# Patient Record
Sex: Female | Born: 1960 | Race: White | Hispanic: No | Marital: Single | Smoking: Current every day smoker
Health system: Southern US, Community
[De-identification: ages and names within clinical notes are randomized; demographics above are authoritative.]

## PROBLEM LIST (undated history)

## (undated) DIAGNOSIS — M1990 Unspecified osteoarthritis, unspecified site: Secondary | ICD-10-CM

## (undated) DIAGNOSIS — F1010 Alcohol abuse, uncomplicated: Secondary | ICD-10-CM

---

## 2015-05-04 ENCOUNTER — Encounter (HOSPITAL_COMMUNITY): Payer: Self-pay | Admitting: *Deleted

## 2015-05-04 ENCOUNTER — Emergency Department (HOSPITAL_COMMUNITY)
Admission: EM | Admit: 2015-05-04 | Discharge: 2015-05-04 | Disposition: A | Discharge status: Home / Self Care | Payer: Self-pay | Attending: Emergency Medicine | Admitting: Emergency Medicine

## 2015-05-04 DIAGNOSIS — Z5900 Homelessness unspecified: Secondary | ICD-10-CM

## 2015-05-04 DIAGNOSIS — E860 Dehydration: Secondary | ICD-10-CM | POA: Insufficient documentation

## 2015-05-04 DIAGNOSIS — Z79899 Other long term (current) drug therapy: Secondary | ICD-10-CM | POA: Insufficient documentation

## 2015-05-04 DIAGNOSIS — R531 Weakness: Secondary | ICD-10-CM | POA: Insufficient documentation

## 2015-05-04 DIAGNOSIS — Z7982 Long term (current) use of aspirin: Secondary | ICD-10-CM | POA: Insufficient documentation

## 2015-05-04 DIAGNOSIS — Z720 Tobacco use: Secondary | ICD-10-CM | POA: Insufficient documentation

## 2015-05-04 DIAGNOSIS — R42 Dizziness and giddiness: Secondary | ICD-10-CM | POA: Insufficient documentation

## 2015-05-04 LAB — CBC WITH DIFFERENTIAL/PLATELET
BASOS ABS: 0.1 10*3/uL (ref 0.0–0.1)
BASOS PCT: 0 % (ref 0–1)
Eosinophils Absolute: 0.1 10*3/uL (ref 0.0–0.7)
Eosinophils Relative: 1 % (ref 0–5)
HCT: 42.1 % (ref 36.0–46.0)
HEMOGLOBIN: 14.3 g/dL (ref 12.0–15.0)
Lymphocytes Relative: 17 % (ref 12–46)
Lymphs Abs: 2.1 10*3/uL (ref 0.7–4.0)
MCH: 32.4 pg (ref 26.0–34.0)
MCHC: 34 g/dL (ref 30.0–36.0)
MCV: 95.5 fL (ref 78.0–100.0)
MONO ABS: 1.9 10*3/uL — AB (ref 0.1–1.0)
Monocytes Relative: 16 % — ABNORMAL HIGH (ref 3–12)
Neutro Abs: 8.1 10*3/uL — ABNORMAL HIGH (ref 1.7–7.7)
Neutrophils Relative %: 66 % (ref 43–77)
Platelets: 207 10*3/uL (ref 150–400)
RBC: 4.41 MIL/uL (ref 3.87–5.11)
RDW: 13.7 % (ref 11.5–15.5)
WBC: 12.2 10*3/uL — AB (ref 4.0–10.5)

## 2015-05-04 LAB — BASIC METABOLIC PANEL
Anion gap: 19 — ABNORMAL HIGH (ref 5–15)
BUN: 10 mg/dL (ref 6–20)
CALCIUM: 10 mg/dL (ref 8.9–10.3)
CO2: 25 mmol/L (ref 22–32)
Chloride: 92 mmol/L — ABNORMAL LOW (ref 101–111)
Creatinine, Ser: 1.05 mg/dL — ABNORMAL HIGH (ref 0.44–1.00)
GFR calc Af Amer: >60 mL/min (ref >60)
GFR, EST NON AFRICAN AMERICAN: 59 mL/min — AB (ref >60)
Glucose, Bld: 80 mg/dL (ref 65–99)
Potassium: 3.4 mmol/L — ABNORMAL LOW (ref 3.5–5.1)
SODIUM: 136 mmol/L (ref 135–145)

## 2015-05-04 MED ORDER — SODIUM CHLORIDE 0.9 % IV BOLUS (SEPSIS): 1000.0000 mL | Freq: Once | INTRAVENOUS | Status: DC
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
## 2015-05-04 NOTE — ED Provider Notes (Signed)
CSN: [id number]     Arrival date & time 05/04/15  1356 History   First MD Initiated Contact with Patient 05/04/15 1516     Chief Complaint  Patient presents with  . Dehydration     (Consider location/radiation/quality/duration/timing/severity/associated sxs/prior Treatment) HPI Comments: Patient is a 54 year old female with history of osteoarthritis of the hip, but no other significant medical history. She presents for complaints of dizziness and possible dehydration. She states that she relocated here approximately 2 months ago from [place] to be with family who is since moved. 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 The elderly female's grandson apparently removed her from the resident's after the passing of the grandmother. Since that time, the patient has had nowhere to stay and has no family locally. She states she is just been walking around town. While she was walking today she became dizzy and felt as though she was going to pass out. She reports having little to drink and nothing to eat in the past 2 days. She denies fevers or chills. She denies chest pain or shortness of breath.  The history is provided by the patient.    History reviewed. No pertinent past medical history. Past Surgical History  Procedure Laterality Date  . Cesarean section     No family history on file. History  Substance Use Topics  . Smoking status: Current Every Day Smoker -- 0.50 packs/day    Types: Cigarettes  . Smokeless tobacco: Not on file  . Alcohol Use: Yes     Comment: occasionally   OB History    No data available     Review of Systems  All other systems reviewed and are negative.     Allergies  Review of patient's allergies indicates no known allergies.  Home Medications   Prior to Admission medications   Medication Sig Start Date End Date Taking? Authorizing Provider  aspirin-acetaminophen-caffeine (EXCEDRIN MIGRAINE) [id number] MG per tablet  Take 1 tablet by mouth every 6 (six) hours as needed for headache.   Yes Historical Provider, MD  doxylamine, Sleep, (UNISOM) 25 MG tablet Take 25 mg by mouth at bedtime as needed for sleep.   Yes Historical Provider, MD  hydroxypropyl methylcellulose / hypromellose (ISOPTO TEARS / GONIOVISC) 2.5 % ophthalmic solution Place 1 drop into both eyes 3 (three) times daily.   Yes Historical Provider, MD   BP 98/58 mmHg  Pulse 119  Temp(Src) 98.6 F (37 C) (Oral)  Resp 18  SpO2 99% Physical Exam  Constitutional: She is oriented to person, place, and time. She appears well-developed and well-nourished. No distress.  Patient appears somewhat anxious.  HENT:  Head: Normocephalic and atraumatic.  Neck: Normal range of motion. Neck supple.  Cardiovascular: Exam reveals no gallop and no friction rub.   No murmur heard. Heart is tachycardic but regular.  Pulmonary/Chest: Effort normal and breath sounds normal. No respiratory distress. She has no wheezes.  Abdominal: Soft. Bowel sounds are normal. She exhibits no distension. There is no tenderness.  Musculoskeletal: Normal range of motion.  Neurological: She is alert and oriented to person, place, and time.  Skin: Skin is warm and dry. She is not diaphoretic.  Psychiatric: Her behavior is normal. Judgment and thought content normal. Her mood appears anxious. Her speech is rapid and/or pressured. Cognition and memory are normal.  Nursing note and vitals reviewed.   ED Course  Procedures (including critical care time) Labs Review Labs Reviewed  BASIC METABOLIC PANEL  CBC WITH DIFFERENTIAL/PLATELET    Imaging Review No results found.   EKG Interpretation None      MDM   Final diagnoses:  None    Patient presents here with complaints of weakness and dizziness and not having had much to eat or drink over the past 2 days for reasons listed in the history of present illness. She appears hemodynamically stable and neurologically intact.  She was given normal saline and laboratory studies are unremarkable. I see nothing medical keep her in the hospital for an believe she is appropriate for discharge.  She is currently homeless and has nowhere to go. She has no immediate family or friends in the area. She will be given the information for some of the homeless shelters in the area and will be discharged to home.    Geoffery Lyonsouglas Jazzma Neidhardt, MD 05/04/15 [phone]

## 2015-05-04 NOTE — ED Notes (Signed)
Pt. Remains in no distress; and I have given pt. Information I obtained earlier today from our social worker about the availability of local shelters.  Our S.W. Specifically [place].  I contacted them and they stated that they do have available beds.  The pt. Subsequently called them also; and they requested her to call them back at 1900 hours. Also I have obtained a [facility] voucher from [facility] To get her to the shelter.  Pt. Is tearfully grateful for the assistance.  I explain that we are only too happy to assist however we can.

## 2015-05-04 NOTE — Discharge Instructions (Signed)
Dehydration, Adult Dehydration is when you lose more fluids from the body than you take in. Vital organs like the kidneys, brain, and heart cannot function without a proper amount of fluids and salt. Any loss of fluids from the body can cause dehydration.  CAUSES   Vomiting.  Diarrhea.  Excessive sweating.  Excessive urine output.  Fever. SYMPTOMS  Mild dehydration  Thirst.  Dry lips.  Slightly dry mouth. Moderate dehydration  Very dry mouth.  Sunken eyes.  Skin does not bounce back quickly when lightly pinched and released.  Dark urine and decreased urine production.  Decreased tear production.  Headache. Severe dehydration  Very dry mouth.  Extreme thirst.  Rapid, weak pulse (more than 100 beats per minute at rest).  Cold hands and feet.  Not able to sweat in spite of heat and temperature.  Rapid breathing.  Blue lips.  Confusion and lethargy.  Difficulty being awakened.  Minimal urine production.  No tears. DIAGNOSIS  Your caregiver will diagnose dehydration based on your symptoms and your exam. Blood and urine tests will help confirm the diagnosis. The diagnostic evaluation should also identify the cause of dehydration. TREATMENT  Treatment of mild or moderate dehydration can often be done at home by increasing the amount of fluids that you drink. It is best to drink small amounts of fluid more often. Drinking too much at one time can make vomiting worse. Refer to the home care instructions below. Severe dehydration needs to be treated at the hospital where you will probably be given intravenous (IV) fluids that contain water and electrolytes. HOME CARE INSTRUCTIONS   Ask your caregiver about specific rehydration instructions.  Drink enough fluids to keep your urine clear or pale yellow.  Drink small amounts frequently if you have nausea and vomiting.  Eat as you normally do.  Avoid:  Foods or drinks high in sugar.  Carbonated  drinks.  Juice.  Extremely hot or cold fluids.  Drinks with caffeine.  Fatty, greasy foods.  Alcohol.  Tobacco.  Overeating.  Gelatin desserts.  Wash your hands well to avoid spreading bacteria and viruses.  Only take over-the-counter or prescription medicines for pain, discomfort, or fever as directed by your caregiver.  Ask your caregiver if you should continue all prescribed and over-the-counter medicines.  Keep all follow-up appointments with your caregiver. SEEK MEDICAL CARE IF:  You have abdominal pain and it increases or stays in one area (localizes).  You have a rash, stiff neck, or severe headache.  You are irritable, sleepy, or difficult to awaken.  You are weak, dizzy, or extremely thirsty. SEEK IMMEDIATE MEDICAL CARE IF:   You are unable to keep fluids down or you get worse despite treatment.  You have frequent episodes of vomiting or diarrhea.  You have blood or green matter (bile) in your vomit.  You have blood in your stool or your stool looks black and tarry.  You have not urinated in 6 to 8 hours, or you have only urinated a small amount of very dark urine.  You have a fever.  You faint. MAKE SURE YOU:   Understand these instructions.  Will watch your condition.  Will get help right away if you are not doing well or get worse. Document Released: 11/15/2005 Document Revised: 02/07/2012 Document Reviewed: 07/05/2011 Exit[employer] Patient Information 2015 [employer]. This information is not intended to replace advice given to you by your health care provider. Make sure you discuss any questions you have with your health care   provider.  

## 2015-10-25 ENCOUNTER — Emergency Department
Admission: EM | Admit: 2015-10-25 | Discharge: 2015-10-25 | Discharge status: Against Medical Advice | Payer: Self-pay | Attending: Emergency Medicine | Admitting: Emergency Medicine

## 2015-10-25 ENCOUNTER — Encounter: Payer: Self-pay | Admitting: Emergency Medicine

## 2015-10-25 DIAGNOSIS — K0889 Other specified disorders of teeth and supporting structures: Secondary | ICD-10-CM | POA: Insufficient documentation

## 2015-10-25 DIAGNOSIS — F17210 Nicotine dependence, cigarettes, uncomplicated: Secondary | ICD-10-CM | POA: Insufficient documentation

## 2015-10-25 NOTE — ED Notes (Signed)
States toothache x 3 months, worse 4 days

## 2015-10-25 NOTE — ED Notes (Addendum)
Pt left without being seen from the d pod waiting room.  Patient was under the impression that she could have her tooth pulled.  Patient left.

## 2015-10-27 ENCOUNTER — Encounter (HOSPITAL_COMMUNITY): Payer: Self-pay | Admitting: Emergency Medicine

## 2015-10-27 ENCOUNTER — Emergency Department (HOSPITAL_COMMUNITY)
Admission: EM | Admit: 2015-10-27 | Discharge: 2015-10-29 | Disposition: A | Discharge status: Other Acute Inpatient Hospital | Payer: Federal, State, Local not specified - Other | Attending: Emergency Medicine | Admitting: Emergency Medicine

## 2015-10-27 DIAGNOSIS — F17210 Nicotine dependence, cigarettes, uncomplicated: Secondary | ICD-10-CM | POA: Insufficient documentation

## 2015-10-27 DIAGNOSIS — Z792 Long term (current) use of antibiotics: Secondary | ICD-10-CM | POA: Insufficient documentation

## 2015-10-27 DIAGNOSIS — F4323 Adjustment disorder with mixed anxiety and depressed mood: Secondary | ICD-10-CM | POA: Diagnosis present

## 2015-10-27 DIAGNOSIS — F32A Depression, unspecified: Secondary | ICD-10-CM

## 2015-10-27 DIAGNOSIS — Z5900 Homelessness unspecified: Secondary | ICD-10-CM

## 2015-10-27 DIAGNOSIS — R062 Wheezing: Secondary | ICD-10-CM | POA: Insufficient documentation

## 2015-10-27 DIAGNOSIS — K029 Dental caries, unspecified: Secondary | ICD-10-CM | POA: Insufficient documentation

## 2015-10-27 DIAGNOSIS — R45851 Suicidal ideations: Secondary | ICD-10-CM

## 2015-10-27 DIAGNOSIS — F329 Major depressive disorder, single episode, unspecified: Secondary | ICD-10-CM | POA: Insufficient documentation

## 2015-10-27 DIAGNOSIS — F1020 Alcohol dependence, uncomplicated: Secondary | ICD-10-CM | POA: Insufficient documentation

## 2015-10-27 DIAGNOSIS — Z3202 Encounter for pregnancy test, result negative: Secondary | ICD-10-CM | POA: Insufficient documentation

## 2015-10-27 LAB — CBC WITH DIFFERENTIAL/PLATELET
Basophils Absolute: 0.1 10*3/uL (ref 0.0–0.1)
Basophils Relative: 1 %
EOS ABS: 0.1 10*3/uL (ref 0.0–0.7)
EOS PCT: 2 %
HCT: 43.1 % (ref 36.0–46.0)
Hemoglobin: 14.4 g/dL (ref 12.0–15.0)
LYMPHS ABS: 3.1 10*3/uL (ref 0.7–4.0)
LYMPHS PCT: 49 %
MCH: 31 pg (ref 26.0–34.0)
MCHC: 33.4 g/dL (ref 30.0–36.0)
MCV: 92.7 fL (ref 78.0–100.0)
MONO ABS: 0.5 10*3/uL (ref 0.1–1.0)
MONOS PCT: 8 %
Neutro Abs: 2.6 10*3/uL (ref 1.7–7.7)
Neutrophils Relative %: 40 %
PLATELETS: 263 10*3/uL (ref 150–400)
RBC: 4.65 MIL/uL (ref 3.87–5.11)
RDW: 17.7 % — ABNORMAL HIGH (ref 11.5–15.5)
WBC: 6.4 10*3/uL (ref 4.0–10.5)

## 2015-10-27 LAB — SALICYLATE LEVEL: Salicylate Lvl: <4 mg/dL (ref 2.8–30.0)

## 2015-10-27 LAB — COMPREHENSIVE METABOLIC PANEL
ALT: 22 U/L (ref 14–54)
AST: 23 U/L (ref 15–41)
Albumin: 4.1 g/dL (ref 3.5–5.0)
Alkaline Phosphatase: 124 U/L (ref 38–126)
Anion gap: 14 (ref 5–15)
BUN: 6 mg/dL (ref 6–20)
CALCIUM: 8.8 mg/dL — AB (ref 8.9–10.3)
CO2: 26 mmol/L (ref 22–32)
CREATININE: 0.46 mg/dL (ref 0.44–1.00)
Chloride: 105 mmol/L (ref 101–111)
GFR calc Af Amer: >60 mL/min (ref >60)
GFR calc non Af Amer: >60 mL/min (ref >60)
GLUCOSE: 97 mg/dL (ref 65–99)
Potassium: 3.6 mmol/L (ref 3.5–5.1)
Sodium: 145 mmol/L (ref 135–145)
TOTAL PROTEIN: 7.3 g/dL (ref 6.5–8.1)
Total Bilirubin: 0.5 mg/dL (ref 0.3–1.2)

## 2015-10-27 LAB — ETHANOL: Alcohol, Ethyl (B): 326 mg/dL (ref <5)

## 2015-10-27 LAB — PROTIME-INR
INR: 0.96 (ref 0.00–1.49)
PROTHROMBIN TIME: 13 s (ref 11.6–15.2)

## 2015-10-27 MED ORDER — LORAZEPAM 2 MG/ML IJ SOLN
1.0000 mg | Freq: Once | INTRAMUSCULAR | Status: AC
  Administered 2015-10-27: 1 mg via INTRAVENOUS
  Filled 2015-10-27: qty 1

## 2015-10-27 MED ORDER — IBUPROFEN 200 MG PO TABS
600.0000 mg | ORAL_TABLET | Freq: Three times a day (TID) | ORAL | Status: DC | PRN
  Administered 2015-10-27 – 2015-10-28 (×3): 600 mg via ORAL
  Filled 2015-10-27 (×3): qty 3

## 2015-10-27 MED ORDER — NICOTINE 21 MG/24HR TD PT24
21.0000 mg | MEDICATED_PATCH | Freq: Every day | TRANSDERMAL | Status: DC
  Administered 2015-10-27 – 2015-10-28 (×2): 21 mg via TRANSDERMAL
  Filled 2015-10-27 (×2): qty 1

## 2015-10-27 MED ORDER — ONDANSETRON HCL 4 MG PO TABS: 4.0000 mg | ORAL_TABLET | Freq: Three times a day (TID) | ORAL | Status: DC | PRN

## 2015-10-27 MED ORDER — M.V.I. ADULT IV INJ
INJECTION | Freq: Once | INTRAVENOUS | Status: AC
  Administered 2015-10-27: 22:00:00 via INTRAVENOUS
  Filled 2015-10-27: qty 1000

## 2015-10-27 NOTE — ED Notes (Addendum)
PT BIB EMS; Per EMS pt c/o suicidal ideation and extreme loneliness; EMS states that pt reported drinking alcohol to alleviate her dental pain that was not resolved after going to the dentist; EMS states that pt reports having several shots today; when this nurse asked pt about suicidal ideation, pt appeared surprised and said "God, No"; pt states she called EMS because her tooth was hurting and she couldn't take the pain; pt observed making phone call and saying "I'm in the hospital because I thought I was having a heart attack".

## 2015-10-27 NOTE — ED Notes (Addendum)
Pt complaint of upper right teeth pain for a week self treating with ETOH (half a fifth yesterday). When questioned about ideation of SI pt states "I was just joking." Pt tearful on assessment. With completion of assessment pt informed that we take SI threats seriously and must follow protocol; changing into scrubs for pt/staff safety. At first pt agrees but then refuses stating "I am not putting on those things. Look at them." Pfeiffer made aware and reports will evaluate pt.
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
## 2015-10-27 NOTE — B[facility] Counselor (Signed)
Disposition: Per Donell SievertSpencer Simon, PA, Pt to be re-evaluated by psychiatry in the morning once BAL has lowered.   Cyndie Mull- Machell Wirthlin, [facility]   Therapeutic Triage

## 2015-10-27 NOTE — ED Notes (Signed)
Robert at bedside attempting blood draw.

## 2015-10-27 NOTE — ED Provider Notes (Addendum)
CSN: [id number]     Arrival date & time 10/27/15  1724 History   First MD Initiated Contact with Patient 10/27/15 1748     Chief Complaint  Patient presents with  . Suicidal     (Consider location/radiation/quality/duration/timing/severity/associated sxs/prior Treatment) HPI Patient called 911 with chief complaint of suicidal ideation. The patient reports she had been drinking yesterday, but not today. She states she is drinking to alleviate dental pain. She reports she does not have any pain at this time and now wants to leave the hospital. Once here, the patient emphatically denied suicidal ideation to me. Then very shortly she was extremely tearful and admitted that she was suffering from depression which she reports she has never really had before. The EMS the patient had admitted extreme loneliness. The patient states she is currently living in a shelter [facility]. She does not have any friends or family in the area. She reports she has a [place] who is in [place]. History reviewed. No pertinent past medical history. Past Surgical History  Procedure Laterality Date  . Cesarean section     History reviewed. No pertinent family history. Social History  Substance Use Topics  . Smoking status: Current Every Day Smoker -- 0.50 packs/day    Types: Cigarettes  . Smokeless tobacco: None  . Alcohol Use: Yes     Comment: occasionally   OB History    No data available     Review of Systems 10 Systems reviewed and are negative for acute change except as noted in the HPI.    Allergies  Review of patient's allergies indicates no known allergies.  Home Medications   Prior to Admission medications   Medication Sig Start Date End Date Taking? Authorizing Provider  amoxicillin (AMOXIL) 500 MG capsule Take 500 mg by mouth 3 (three) times daily.   Yes Historical Provider, MD  diphenhydramine-acetaminophen (TYLENOL PM) 25-500 MG TABS tablet Take 1 tablet by mouth at bedtime  as needed (sleep).   Yes Historical Provider, MD  hydrOXYzine (VISTARIL) 25 MG capsule Take 25 mg by mouth every 4 (four) hours as needed for anxiety.  08/02/15  Yes Historical Provider, MD   There were no vitals taken for this visit. Physical Exam  Constitutional: She appears well-developed and well-nourished.  The patient smells of alcohol. She is up and ambulating in the emergency department. She has no respiratory distress. Her color is good.  HENT:  Head: Normocephalic and atraumatic.  Right Ear: External ear normal.  Left Ear: External ear normal.  Nose: Nose normal.  Patient has extremely poor dentition with several teeth decayed to the root level. There is however no drainage or discharge around the teeth. The tooth that her most recently been bothering her is the inferior posterior right molar. Is chronically very decayed. Posterior oropharynx is widely patent. There is no trismus normal range of motion of the jaw. No associated facial swelling.  Eyes: EOM are normal. Pupils are equal, round, and reactive to light.  Neck: Neck supple.  Cardiovascular: Normal rate, regular rhythm, normal heart sounds and intact distal pulses.   Pulmonary/Chest: Effort normal. No respiratory distress. She has wheezes.  Patient has bilateral respiratory wheezes.  Abdominal: Soft. Bowel sounds are normal. She exhibits no distension. There is no tenderness.  Musculoskeletal: Normal range of motion. She exhibits no edema.  Neurological: She is alert. She has normal strength. Coordination normal. GCS eye subscore is 4. GCS verbal subscore is 5. GCS motor subscore is 6.  Patient is alert. She was able to name the month but off on the date by 2 days. She is aware of the year. Patient was unsure of the city we were in. She reported having called 911 and therefore not sure. The patient's movements or cornea purposeful she has been ambulating and sitting and standing without gait instability.  Skin: Skin is warm,  dry and intact.  Psychiatric:  The patient's mood is extremely labile. She goes from trying to smile and tell me that she is not suicidal and then will become profusely tearful. The patient does smell of alcohol currently. At this time she is intoxicated but alert and aware to her surroundings.    ED Course  Procedures (including critical care time) Labs Review Labs Reviewed  COMPREHENSIVE METABOLIC PANEL  ETHANOL  SALICYLATE LEVEL  CBC WITH DIFFERENTIAL/PLATELET  PROTIME-INR  PREGNANCY, URINE  URINALYSIS, ROUTINE W REFLEX MICROSCOPIC (NOT AT [facility])  URINE RAPID DRUG SCREEN, HOSP PERFORMED    Imaging Review No results found. I have personally reviewed and evaluated these images and lab results as part of my medical decision-making.   EKG Interpretation None      MDM   Final diagnoses:  Depression  Suicidal ideation  Alcoholism (HCC)  Homelessness   Patient initiated the encounter by calling 911 with reported suicidal ideation and expressing extreme loneliness DMS. The patient does appear to be a chronic alcoholic. She is intoxicated at this time but alert and conversant. She appears at risk for severe depression and isolation. Although patient retracted her suicidal statements she appears to be very labile and mood and I feel that she is at risk of impulsive behavior and possible suicide. At this time the patient will be placed in IVC status pending psychiatric evaluation.   Behavioral health consultation advises for the patient to be reassessed later in the morning when alcohol level has decreased. Arby Barrette, MD 10/27/15 1901  Arby Barrette, MD 10/28/15 [phone]

## 2015-10-27 NOTE — ED Notes (Signed)
Pt has one pair of paints brown in color and one purple t shirt white tennis shoes  a cell phone black in color and a black pocket book all place in personal belonging bag and placed at nurse station at room 25.

## 2015-10-27 NOTE — ED Notes (Signed)
Critical ETOH 326. Nurse made aware of same.

## 2015-10-28 ENCOUNTER — Inpatient Hospital Stay (HOSPITAL_COMMUNITY): Admit: 2015-10-28 | Payer: Self-pay

## 2015-10-28 DIAGNOSIS — F1020 Alcohol dependence, uncomplicated: Secondary | ICD-10-CM | POA: Diagnosis not present

## 2015-10-28 DIAGNOSIS — F4323 Adjustment disorder with mixed anxiety and depressed mood: Secondary | ICD-10-CM | POA: Diagnosis not present

## 2015-10-28 LAB — URINALYSIS, ROUTINE W REFLEX MICROSCOPIC
Bilirubin Urine: NEGATIVE
Glucose, UA: NEGATIVE mg/dL
Ketones, ur: NEGATIVE mg/dL
Leukocytes, UA: NEGATIVE
NITRITE: NEGATIVE
Protein, ur: 100 mg/dL — AB
SPECIFIC GRAVITY, URINE: 1.019 (ref 1.005–1.030)
pH: 5.5 (ref 5.0–8.0)

## 2015-10-28 LAB — RAPID URINE DRUG SCREEN, HOSP PERFORMED
Amphetamines: NOT DETECTED
Barbiturates: NOT DETECTED
Benzodiazepines: NOT DETECTED
COCAINE: NOT DETECTED
OPIATES: NOT DETECTED
TETRAHYDROCANNABINOL: NOT DETECTED

## 2015-10-28 LAB — ETHANOL: ALCOHOL ETHYL (B): 136 mg/dL — AB (ref <5)

## 2015-10-28 LAB — URINE MICROSCOPIC-ADD ON

## 2015-10-28 LAB — PREGNANCY, URINE: Preg Test, Ur: NEGATIVE

## 2015-10-28 MED ORDER — LORAZEPAM 2 MG/ML IJ SOLN: 0.0000 mg | Freq: Four times a day (QID) | INTRAMUSCULAR | Status: DC

## 2015-10-28 MED ORDER — THIAMINE HCL 100 MG/ML IJ SOLN: 100.0000 mg | Freq: Every day | INTRAMUSCULAR | Status: DC

## 2015-10-28 MED ORDER — LORAZEPAM 1 MG PO TABS
0.0000 mg | ORAL_TABLET | Freq: Two times a day (BID) | ORAL | Status: DC
Start: 2015-10-28 — End: 2015-10-29

## 2015-10-28 MED ORDER — LORAZEPAM 1 MG PO TABS
0.0000 mg | ORAL_TABLET | Freq: Four times a day (QID) | ORAL | Status: DC
Start: 2015-10-28 — End: 2015-10-29
  Administered 2015-10-28: 1 mg via ORAL
  Filled 2015-10-28: qty 1

## 2015-10-28 MED ORDER — LORAZEPAM 1 MG PO TABS
2.0000 mg | ORAL_TABLET | Freq: Once | ORAL | Status: AC
  Administered 2015-10-28: 2 mg via ORAL
  Filled 2015-10-28: qty 2

## 2015-10-28 MED ORDER — LORAZEPAM 2 MG/ML IJ SOLN: 0.0000 mg | Freq: Two times a day (BID) | INTRAMUSCULAR | Status: DC

## 2015-10-28 MED ORDER — LORAZEPAM 1 MG PO TABS: 1.0000 mg | ORAL_TABLET | Freq: Once | ORAL | Status: DC

## 2015-10-28 MED ORDER — THIAMINE HCL 100 MG/ML IJ SOLN
100.0000 mg | Freq: Every day | INTRAMUSCULAR | Status: DC
Start: 2015-10-28 — End: 2015-10-28

## 2015-10-28 MED ORDER — TRAZODONE HCL 100 MG PO TABS
100.0000 mg | ORAL_TABLET | Freq: Every day | ORAL | Status: DC
  Administered 2015-10-28: 100 mg via ORAL
  Filled 2015-10-28: qty 1

## 2015-10-28 MED ORDER — SODIUM CHLORIDE 0.9 % IV SOLN
INTRAVENOUS | Status: DC
  Administered 2015-10-28: 02:00:00 via INTRAVENOUS

## 2015-10-28 MED ORDER — VITAMIN B-1 100 MG PO TABS: 100.0000 mg | ORAL_TABLET | Freq: Every day | ORAL | Status: DC

## 2015-10-28 MED ORDER — VITAMIN B-1 100 MG PO TABS
100.0000 mg | ORAL_TABLET | Freq: Every day | ORAL | Status: DC
  Administered 2015-10-28: 100 mg via ORAL
  Filled 2015-10-28: qty 1

## 2015-10-28 MED ORDER — PENICILLIN V POTASSIUM 500 MG PO TABS
500.0000 mg | ORAL_TABLET | Freq: Four times a day (QID) | ORAL | Status: DC
  Administered 2015-10-28 – 2015-10-29 (×3): 500 mg via ORAL
  Filled 2015-10-28 (×3): qty 1

## 2015-10-28 NOTE — ED Notes (Signed)
Per Madilyn Hookees, MD, normal saline discontinued

## 2015-10-28 NOTE — Progress Notes (Signed)
CM spoke with pt who confirms uninsured [place] resident with [place] pcp.  CM discussed and provided written information for uninsured accepting pcps, discussed the importance of pcp vs EDP services for f/u care, www.needymeds.org, www.goodrx.com, discounted pharmacies and other [place] resources such as [facility] , [facility], affordable care act, financial assistance, uninsured dental services, [place] med assist, DSS and  health department  Reviewed resources for [place] uninsured accepting pcps like [facility], family medicine at [address], [facility] [place], [facility], local urgent care centers, [facility], [facility] family practice, general medical clinics, family services of the [place], [facility] urgent care plus others, medication resources, [facility] out patient pharmacies and housing Pt voiced understanding and appreciation of resources provided   Provided [facility] contact information Pt seen by Kennyth ArnoldStacy from [facility]  [place] Pt has an active orange card expiring in march 2017 and allowed [facility] [place] Pt informed Stacy her pcp gave her an antibiotic to take prior to having dental procedure Pt given [facility] clinic information for homeless resource Pt does not have to have an orange card to go to [facility] ([facility] resource [facility]) to get services like showers, laundry, computer access, job counseling, resume help, referrals for food/clothing, Mental Health assistance & housing counseling Pt made aware  Pt will also have access to [facility] CM services and possible dental services prn Pt aware

## 2015-10-28 NOTE — Consult Note (Signed)
[facility] Psychiat[facility] Consult   Reason for Consult:  Alcohol   Intoxication, Suicidal ideation. Referring Physician:  EDP Patient Identification: Amanda Huff MRN:  [id number] Principal Diagnosis: Alcohol use disorder, moderate, dependence ([place]) Diagnosis:   Patient Active Problem List   Diagnosis Date Noted  . Alcohol use disorder, moderate, dependence ([place]) [F10.20] 10/28/2015    Priority: High  . Adjustment disorder with mixed anxiety and depressed mood [F43.23] 10/28/2015    Total Time spent with patient: 45 minutes  Subjective:   Amanda Huff is a 54 y.o. female patient admitted with Alcohol/intoxication, . Suicidal ideation  HPI:  Caucasian female, 54 years old was evaluated for Alcohol intoxication and use.  Patient was brought in by EMS when she reported feeling suicidal and lonely  She also was drinking and stated that she was drinking Alcohol to treat tooth pain. Patient admitted that she has issues with Alcohol but stated that she does not drink daily.  She moved down to [facility] to stay with her daughter and son in-law.  They have moved back to [place] leaving her here alone.  Patient states she has no body here and that she feels lonely.  She reports previous diagnosis and treatment for Depression long time ago in 1989.   She vehemently denies Suicidal ideation and stated that she was intoxicated when she stated she was going to kill herself.  She has been accepted for admission and we will be seeking placement at any facility with available bed.  Past Psychiatric History:  Depression long time ago  Risk to Self: Suicidal Ideation: No-Not Currently/Within Last 6 Months Suicidal Intent: No Is patient at risk for suicide?: No Suicidal Plan?: No Access to Means: No What has been your use of drugs/alcohol within the last 12 months?: Regular use of etoh approximately 1x per week How many times?: 0 Other Self Harm Risks: Possible etoh abuse Triggers for Past Attempts:   (n/a) Intentional Self Injurious Behavior: None Risk to Others: Homicidal Ideation: No Thoughts of Harm to Others: No Current Homicidal Intent: No Current Homicidal Plan: No Access to Homicidal Means: No Identified Victim: n/a History of harm to others?: No Assessment of Violence: None Noted Violent Behavior Description: No known hx of violence Does patient have access to weapons?: No Criminal Charges Pending?: No Does patient have a court date: No Prior Inpatient Therapy: Prior Inpatient Therapy: Yes Prior Therapy Dates: "Many years ago", 1990's Prior Therapy Facilty/Provider(s): Facilities in [place] Reason for Treatment: Depression Prior Outpatient Therapy: Prior Outpatient Therapy: Yes Prior Therapy Dates: "Many years ago" Prior Therapy Facilty/Provider(s): in [place] Reason for Treatment: Depression Does patient have an ACCT team?: No Does patient have Intensive In-House Services?  : No Does patient have [facility] services? : No Does patient have [facility] services?: No  Past Medical History: History reviewed. No pertinent past medical history.  Past Surgical History  Procedure Laterality Date  . Cesarean section     Family History: History reviewed. No pertinent family history.   Family Psychiatric  History: DENIES Social History:  History  Alcohol Use  . Yes    Comment: occasionally     History  Drug Use No    Social History   Social History  . Marital Status: Single    Spouse Name: N/A  . Number of Children: N/A  . Years of Education: N/A   Social History Main Topics  . Smoking status: Current Every Day Smoker -- 0.50 packs/day    Types: Cigarettes  . Smokeless tobacco:  None  . Alcohol Use: Yes     Comment: occasionally  . Drug Use: No  . Sexual Activity: Not Asked   Other Topics Concern  . None   Social History Narrative   Additional Social History:    Pain Medications: See PTA med list Prescriptions: See PTA med list Over the Counter: See PTA med  list History of alcohol / drug use?: Yes Longest period of sobriety (when/how long): Unknown Withdrawal Symptoms:  (Pt denies) Name of Substance 1: Etoh 1 - Age of First Use: teens 1 - Amount (size/oz): 3 vodka tonics 1 - Frequency: 1x weekly 1 - Duration: years 1 - Last Use / Amount: tonight, 10/27/15                   Allergies:  No Known Allergies  Labs:  Results for orders placed or performed during the hospital encounter of 10/27/15 (from the past 48 hour(s))  Comprehensive metabolic panel     Status: Abnormal   Collection Time: 10/27/15  7:50 PM  Result Value Ref Range   Sodium 145 135 - 145 mmol/L   Potassium 3.6 3.5 - 5.1 mmol/L   Chloride 105 101 - 111 mmol/L   CO2 26 22 - 32 mmol/L   Glucose, Bld 97 65 - 99 mg/dL   BUN 6 6 - 20 mg/dL   Creatinine, Ser 0.46 0.44 - 1.00 mg/dL   Calcium 8.8 (L) 8.9 - 10.3 mg/dL   Total Protein 7.3 6.5 - 8.1 g/dL   Albumin 4.1 3.5 - 5.0 g/dL   AST 23 15 - 41 U/L   ALT 22 14 - 54 U/L   Alkaline Phosphatase 124 38 - 126 U/L   Total Bilirubin 0.5 0.3 - 1.2 mg/dL   GFR calc non Af Amer >60 >60 mL/min   GFR calc Af Amer >60 >60 mL/min    Comment: (NOTE) The eGFR has been calculated using the CKD EPI equation. This calculation has not been validated in all clinical situations. eGFR's persistently <60 mL/min signify possible Chronic Kidney Disease.    Anion gap 14 5 - 15  Ethanol     Status: Abnormal   Collection Time: 10/27/15  7:50 PM  Result Value Ref Range   Alcohol, Ethyl (B) 326 (HH) <5 mg/dL    Comment:        LOWEST DETECTABLE LIMIT FOR SERUM ALCOHOL IS 5 mg/dL FOR MEDICAL PURPOSES ONLY CRITICAL RESULT CALLED TO, READ BACK BY AND VERIFIED WITH: DREWERY,K/ED @2040  ON 10/27/15 BY KARCZEWSKI,S.   Salicylate level     Status: None   Collection Time: 10/27/15  7:50 PM  Result Value Ref Range   Salicylate Lvl <7.2 2.8 - 30.0 mg/dL  CBC with Differential     Status: Abnormal   Collection Time: 10/27/15  7:50 PM   Result Value Ref Range   WBC 6.4 4.0 - 10.5 K/uL   RBC 4.65 3.87 - 5.11 MIL/uL   Hemoglobin 14.4 12.0 - 15.0 g/dL   HCT 43.1 36.0 - 46.0 %   MCV 92.7 78.0 - 100.0 fL   MCH 31.0 26.0 - 34.0 pg   MCHC 33.4 30.0 - 36.0 g/dL   RDW 17.7 (H) 11.5 - 15.5 %   Platelets 263 150 - 400 K/uL   Neutrophils Relative % 40 %   Neutro Abs 2.6 1.7 - 7.7 K/uL   Lymphocytes Relative 49 %   Lymphs Abs 3.1 0.7 - 4.0 K/uL   Monocytes Relative 8 %  Monocytes Absolute 0.5 0.1 - 1.0 K/uL   Eosinophils Relative 2 %   Eosinophils Absolute 0.1 0.0 - 0.7 K/uL   Basophils Relative 1 %   Basophils Absolute 0.1 0.0 - 0.1 K/uL  Protime-INR     Status: None   Collection Time: 10/27/15  7:50 PM  Result Value Ref Range   Prothrombin Time 13.0 11.6 - 15.2 seconds   INR 0.96 0.00 - 1.49  Pregnancy, urine     Status: None   Collection Time: 10/28/15  1:05 AM  Result Value Ref Range   Preg Test, Ur NEGATIVE NEGATIVE    Comment:        THE SENSITIVITY OF THIS METHODOLOGY IS >20 mIU/mL.   Urinalysis, Routine w reflex microscopic     Status: Abnormal   Collection Time: 10/28/15  1:05 AM  Result Value Ref Range   Color, Urine YELLOW YELLOW   APPearance CLOUDY (A) CLEAR   Specific Gravity, Urine 1.019 1.005 - 1.030   pH 5.5 5.0 - 8.0   Glucose, UA NEGATIVE NEGATIVE mg/dL   Hgb urine dipstick TRACE (A) NEGATIVE   Bilirubin Urine NEGATIVE NEGATIVE   Ketones, ur NEGATIVE NEGATIVE mg/dL   Protein, ur 100 (A) NEGATIVE mg/dL   Nitrite NEGATIVE NEGATIVE   Leukocytes, UA NEGATIVE NEGATIVE  Urine rapid drug screen (hosp performed)     Status: None   Collection Time: 10/28/15  1:05 AM  Result Value Ref Range   Opiates NONE DETECTED NONE DETECTED   Cocaine NONE DETECTED NONE DETECTED   Benzodiazepines NONE DETECTED NONE DETECTED   Amphetamines NONE DETECTED NONE DETECTED   Tetrahydrocannabinol NONE DETECTED NONE DETECTED   Barbiturates NONE DETECTED NONE DETECTED    Comment:        DRUG SCREEN FOR MEDICAL  PURPOSES ONLY.  IF CONFIRMATION IS NEEDED FOR ANY PURPOSE, NOTIFY LAB WITHIN 5 DAYS.        LOWEST DETECTABLE LIMITS FOR URINE DRUG SCREEN Drug Class       Cutoff (ng/mL) Amphetamine      1000 Barbiturate      200 Benzodiazepine   397 Tricyclics       673 Opiates          300 Cocaine          300 THC              50   Urine microscopic-add on     Status: Abnormal   Collection Time: 10/28/15  1:05 AM  Result Value Ref Range   Squamous Epithelial / LPF 0-5 (A) NONE SEEN    Comment: Please note change in reference range.   WBC, UA 0-5 0 - 5 WBC/hpf    Comment: Please note change in reference range.   RBC / HPF 0-5 0 - 5 RBC/hpf    Comment: Please note change in reference range.   Bacteria, UA RARE (A) NONE SEEN    Comment: Please note change in reference range.   Casts HYALINE CASTS (A) NEGATIVE  Ethanol     Status: Abnormal   Collection Time: 10/28/15  1:07 AM  Result Value Ref Range   Alcohol, Ethyl (B) 136 (H) <5 mg/dL    Comment:        LOWEST DETECTABLE LIMIT FOR SERUM ALCOHOL IS 5 mg/dL FOR MEDICAL PURPOSES ONLY     Current Facility-Administered Medications  Medication Dose Route Frequency Provider Last Rate Last Dose  . ibuprofen (ADVIL,MOTRIN) tablet 600 mg  600 mg Oral Q8H  PRN Charlesetta Shanks, MD   600 mg at 10/28/15 0844  . LORazepam (ATIVAN) injection 0-4 mg  0-4 mg Intravenous 4 times per day Quintella Reichert, MD   0 mg at 10/28/15 1207  . LORazepam (ATIVAN) injection 0-4 mg  0-4 mg Intravenous Q12H Quintella Reichert, MD   0 mg at 10/28/15 0940  . LORazepam (ATIVAN) tablet 0-4 mg  0-4 mg Oral 4 times per day Quintella Reichert, MD   0 mg at 10/28/15 1207  . LORazepam (ATIVAN) tablet 0-4 mg  0-4 mg Oral Q12H Quintella Reichert, MD   0 mg at 10/28/15 0940  . nicotine (NICODERM CQ - dosed in mg/24 hours) patch 21 mg  21 mg Transdermal Daily Charlesetta Shanks, MD   21 mg at 10/28/15 0948  . ondansetron (ZOFRAN) tablet 4 mg  4 mg Oral Q8H PRN Charlesetta Shanks, MD      . penicillin v  potassium (VEETID) tablet 500 mg  500 mg Oral 4 times per day Quintella Reichert, MD   500 mg at 10/28/15 1210  . [START ON 10/29/2015] thiamine (VITAMIN B-1) tablet 100 mg  100 mg Oral Daily Quintella Reichert, MD       Or  . Derrill Memo ON 10/29/2015] thiamine (B-1) injection 100 mg  100 mg Intravenous Daily Quintella Reichert, MD      . traZODone (DESYREL) tablet 100 mg  100 mg Oral QHS Rafeal Skibicki       Current Outpatient Prescriptions  Medication Sig Dispense Refill  . amoxicillin (AMOXIL) 500 MG capsule Take 500 mg by mouth 3 (three) times daily.    . diphenhydramine-acetaminophen (TYLENOL PM) 25-500 MG TABS tablet Take 1 tablet by mouth at bedtime as needed (sleep).    . hydrOXYzine (VISTARIL) 25 MG capsule Take 25 mg by mouth every 4 (four) hours as needed for anxiety.       Musculoskeletal: Strength & Muscle Tone: within normal limits Gait & Station: normal Patient leans: N/A  Psychiatric Specialty Exam: Review of Systems  Constitutional: Negative.   HENT: Negative.   Eyes: Negative.   Respiratory: Negative.   Cardiovascular: Negative.   Gastrointestinal: Negative.   Genitourinary: Negative.   Musculoskeletal: Negative.   Skin: Negative.   Neurological: Negative.   Endo/Heme/Allergies: Negative.     Blood pressure 156/109, pulse 109, temperature 97.7 F (36.5 C), temperature source Oral, resp. rate 18, SpO2 98 %.There is no weight on file to calculate BMI.  General Appearance: Casual  Eye Contact::  Good  Speech:  Clear and Coherent and Normal Rate  Volume:  Normal  Mood:  Angry and Anxious  Affect:  Congruent and Depressed  Thought Process:  Coherent, Goal Directed and Intact  Orientation:  Full (Time, Place, and Person)  Thought Content:  WDL  Suicidal Thoughts:  No  Homicidal Thoughts:  No  Memory:  Immediate;   Good Recent;   Good Remote;   Good  Judgement:  Impaired  Insight:  Shallow  Psychomotor Activity:  Normal  Concentration:  Good  Recall:  [place] of  Knowledge:Fair  Language: Good  Akathisia:  NA  Handed:  Right  AIMS (if indicated):     Assets:  Desire for Improvement  ADL's:  Intact  Cognition: WNL  Sleep:      Treatment Plan Summary: Daily contact with patient to assess and evaluate symptoms and progress in treatment and Medication management  Disposition: Accepted for admission, will seek placement at any facility with available bed.  We have started  her Alcohol detox treatment with our Ativan protocol.  Delfin Gant   PMHNP-BC 10/28/2015 1:44 PM Patient seen face-to-face for psychiatric evaluation, chart reviewed and case discussed with the physician extender and developed treatment plan. Reviewed the information documented and agree with the treatment plan. Corena Pilgrim, MD

## 2015-10-28 NOTE — ED Notes (Signed)
TTS at bedside. 

## 2015-10-28 NOTE — BH Assessment (Signed)
[facility] by Julieanne CottonJosephine, NP. Patient's chair assignment is #5. Nursing report [phone]. Support paperwork completed. [facility].

## 2015-10-28 NOTE — ED Notes (Signed)
Spoke to Rees, MD, [employer] concerns of patient's heart rate and blood pressure. Dr. Madilyn Hookees stated that she will look into the patient's chart and see what needs to be done.

## 2015-10-28 NOTE — ED Notes (Signed)
Psychiatry at bedside.

## 2015-10-28 NOTE — ED Provider Notes (Signed)
Pt here following IVC, EtOH abuse.  Pt this morning denies SI, feels slightly trembly, has dental pain and is currently being treated for dental abscess.  Concern for some EtOH withdrawal given tachycardia, hypertension - treating with ativan.  Treating dental abscess with PCN.  Disposition per psych.    Tilden FossaElizabeth Bevelyn Arriola, MD 10/28/15 [phone]

## 2015-10-28 NOTE — ED Notes (Signed)
MD at bedside. 

## 2015-10-28 NOTE — ED Notes (Signed)
Patient alert and verbal. States she is here because she "self medicated with alcohol from having a tooth ache." Patient denies SI/HI A/V/H. Q 15 minute checks in progress and maintained for safety  Monitoring continues.

## 2015-10-28 NOTE — ED Notes (Signed)
Patient calm and cooperative.  Patient states she never was suicidal.  She states, "I made a statement, but I was never suicidal.  Patient has minimal withdrawal symptoms.  She is on ativan protocol.

## 2015-10-28 NOTE — Progress Notes (Signed)
Please use the resources provided to you in emergency room by case manager to assist with doctor for follow up   You allowed to be seen at [facility] [place] both [place] locations inquire about dental resources  These [place] uninsured resources provide possible primary care providers, resources for discounted medications, housing, dental resources, affordable care act information, plus other resources for [place]     [facility] ([employer]- Go to  [facility]	[place] by 2:00 pm Monday-Friday & sign in at the front desk.  Come if you need showers, laundry, computer access, job counseling, resume help, referral for food/clothing, Mental Health assistance & housing counseling   Medicine, [facility] Schedule an appointment as soon as possible for a visit  [address] [place] [address] [phone]

## 2015-10-29 ENCOUNTER — Inpatient Hospital Stay (HOSPITAL_COMMUNITY)
Admission: AD | Admit: 2015-10-29 | Discharge: 2015-10-31 | DRG: 885 | Disposition: A | Discharge status: Home / Self Care | Payer: Federal, State, Local not specified - Other | Source: Intra-hospital | Attending: Psychiatry | Admitting: Psychiatry

## 2015-10-29 ENCOUNTER — Encounter (HOSPITAL_COMMUNITY): Payer: Self-pay

## 2015-10-29 DIAGNOSIS — F1024 Alcohol dependence with alcohol-induced mood disorder: Secondary | ICD-10-CM | POA: Diagnosis present

## 2015-10-29 DIAGNOSIS — F1014 Alcohol abuse with alcohol-induced mood disorder: Secondary | ICD-10-CM | POA: Diagnosis present

## 2015-10-29 DIAGNOSIS — F331 Major depressive disorder, recurrent, moderate: Secondary | ICD-10-CM | POA: Diagnosis not present

## 2015-10-29 DIAGNOSIS — F329 Major depressive disorder, single episode, unspecified: Secondary | ICD-10-CM | POA: Diagnosis present

## 2015-10-29 DIAGNOSIS — F17210 Nicotine dependence, cigarettes, uncomplicated: Secondary | ICD-10-CM | POA: Diagnosis present

## 2015-10-29 MED ORDER — LORAZEPAM 1 MG PO TABS: 1.0000 mg | ORAL_TABLET | Freq: Four times a day (QID) | ORAL | Status: DC | PRN

## 2015-10-29 MED ORDER — LORAZEPAM 1 MG PO TABS
1.0000 mg | ORAL_TABLET | Freq: Four times a day (QID) | ORAL | Status: AC
  Administered 2015-10-29 (×4): 1 mg via ORAL
  Filled 2015-10-29 (×4): qty 1

## 2015-10-29 MED ORDER — MAGNESIUM HYDROXIDE 400 MG/5ML PO SUSP: 30.0000 mL | Freq: Every day | ORAL | Status: DC | PRN

## 2015-10-29 MED ORDER — LORAZEPAM 1 MG PO TABS: 1.0000 mg | ORAL_TABLET | Freq: Every day | ORAL | Status: DC

## 2015-10-29 MED ORDER — IBUPROFEN 800 MG PO TABS
800.0000 mg | ORAL_TABLET | Freq: Four times a day (QID) | ORAL | Status: DC | PRN
  Administered 2015-10-29 – 2015-10-31 (×4): 800 mg via ORAL
  Filled 2015-10-29 (×4): qty 1

## 2015-10-29 MED ORDER — BENZOCAINE 10 % MT GEL
Freq: Four times a day (QID) | OROMUCOSAL | Status: DC | PRN
  Administered 2015-10-30 – 2015-10-31 (×3): via OROMUCOSAL
  Filled 2015-10-29 (×2): qty 9.4

## 2015-10-29 MED ORDER — ADULT MULTIVITAMIN W/MINERALS CH
1.0000 | ORAL_TABLET | Freq: Every day | ORAL | Status: DC
  Administered 2015-10-29 – 2015-10-31 (×3): 1 via ORAL
  Filled 2015-10-29 (×6): qty 1

## 2015-10-29 MED ORDER — FLUOXETINE HCL 10 MG PO CAPS
10.0000 mg | ORAL_CAPSULE | Freq: Every day | ORAL | Status: DC
  Administered 2015-10-29 – 2015-10-30 (×2): 10 mg via ORAL
  Filled 2015-10-29 (×5): qty 1

## 2015-10-29 MED ORDER — LORAZEPAM 1 MG PO TABS
1.0000 mg | ORAL_TABLET | Freq: Two times a day (BID) | ORAL | Status: DC
  Administered 2015-10-31: 1 mg via ORAL
  Filled 2015-10-29: qty 1

## 2015-10-29 MED ORDER — ALUM & MAG HYDROXIDE-SIMETH 200-200-20 MG/5ML PO SUSP: 30.0000 mL | ORAL | Status: DC | PRN

## 2015-10-29 MED ORDER — ACETAMINOPHEN 325 MG PO TABS
650.0000 mg | ORAL_TABLET | Freq: Four times a day (QID) | ORAL | Status: DC | PRN
  Administered 2015-10-29 – 2015-10-31 (×2): 650 mg via ORAL
  Filled 2015-10-29 (×3): qty 2

## 2015-10-29 MED ORDER — HYDROXYZINE HCL 25 MG PO TABS
25.0000 mg | ORAL_TABLET | ORAL | Status: DC | PRN
Start: 2015-10-29 — End: 2015-11-01
  Filled 2015-10-29: qty 10

## 2015-10-29 MED ORDER — LOPERAMIDE HCL 2 MG PO CAPS: 2.0000 mg | ORAL_CAPSULE | ORAL | Status: DC | PRN

## 2015-10-29 MED ORDER — LORAZEPAM 1 MG PO TABS
1.0000 mg | ORAL_TABLET | Freq: Three times a day (TID) | ORAL | Status: AC
  Administered 2015-10-30 (×3): 1 mg via ORAL
  Filled 2015-10-29 (×3): qty 1

## 2015-10-29 MED ORDER — THIAMINE HCL 100 MG/ML IJ SOLN
100.0000 mg | Freq: Once | INTRAMUSCULAR | Status: AC
  Administered 2015-10-29: 100 mg via INTRAMUSCULAR
  Filled 2015-10-29: qty 2

## 2015-10-29 MED ORDER — TRAZODONE HCL 50 MG PO TABS
50.0000 mg | ORAL_TABLET | Freq: Every evening | ORAL | Status: DC | PRN
  Administered 2015-10-29 – 2015-10-30 (×2): 50 mg via ORAL
  Filled 2015-10-29 (×9): qty 1

## 2015-10-29 MED ORDER — VITAMIN B-1 100 MG PO TABS
100.0000 mg | ORAL_TABLET | Freq: Every day | ORAL | Status: DC
Start: 2015-10-30 — End: 2015-11-01
  Administered 2015-10-30 – 2015-10-31 (×2): 100 mg via ORAL
  Filled 2015-10-29 (×4): qty 1

## 2015-10-29 MED ORDER — ONDANSETRON 4 MG PO TBDP: 4.0000 mg | ORAL_TABLET | Freq: Four times a day (QID) | ORAL | Status: DC | PRN

## 2015-10-29 NOTE — Progress Notes (Signed)
Recreation Therapy Notes  Date: 11.30.2016 Time: 9:30am Location: [address] Group Room  Group Topic: Stress Management  Goal Area(s) Addresses:  Patient will actively participate in stress management techniques presented during session.   Behavioral Response: Did not attend.   Bosco Paparella L Adylynn Hertenstein, LRT/CTRS        Eltha Tingley L 10/29/2015 7:25 PM 

## 2015-10-29 NOTE — [facility] Suicide Risk Assessment (Signed)
[facility] INPATIENT:  Family/Significant Other Suicide Prevention Education  Suicide Prevention Education:  Patient Refusal for Family/Significant Other Suicide Prevention Education: The patient Amanda Huff has refused to provide written consent for family/significant other to be provided Family/Significant Other Suicide Prevention Education during admission and/or prior to discharge.  Physician notified. CSW completed with pt.  Amanda Huff 10/29/2015, 11:44 AM

## 2015-10-29 NOTE — [facility] Group Notes (Signed)
[facility] LCSW Group Therapy   10/29/2015 1:15 PM   Type of Therapy: Group Therapy   Participation Level: Did Not Attend. Patient invited to participate but declined.    Dorothe PeaJonathan F. Shardee Dieu, MSW, LCSWA, LCAS
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
## 2015-10-29 NOTE — Tx Team (Addendum)
Interdisciplinary Treatment Plan Update (Adult)        Date: 10/29/2015   Time Reviewed: 9:30 AM   Progress in Treatment: Improving  Attending groups: Yes Participating in groups: Yes Taking medication as prescribed: Yes  Tolerating medication: Yes  Family/Significant other contact made: No, pt refused family contact Patient understands diagnosis: Yes  Discussing patient identified problems/goals with staff: Yes  Medical problems stabilized or resolved: Yes  Denies suicidal/homicidal ideation: Yes  Issues/concerns per patient self-inventory: Yes  Other:   New problem(s) identified: N/A   Discharge Plan: Pt plans to return home to [place] and will be given a referral for outpatient services.   Reason for Continuation of Hospitalization:   Depression   Anxiety   Medication Stabilization   Comments: N/A   Estimated length of stay:  Discharge anticipated for today 10/31/15   Patient is a 54 year old female admitted for suicidal ideation and extreme loneliness.  EMS stated that pt reported drinking alcohol to alleviate her dental pain that was not resolved after going to the dentist.  EMS stated that pt reports having several shots the day of admission to the ED.  Pt stated she called EMS because her tooth was hurting and she couldn't take the pain. Pt denies SI/HI.  Per EMS, pt had admitted extreme loneliness.  Per Dr.'s notes, pt reported she was extremely tearful and admitted that she was suffering from depression which she reports she has never really had before. Pt observed making phone call and saying "I'm in the hospital because I thought I was having a heart attack". Patient lives in [place], [place].  Patient will benefit from crisis stabilization, medication evaluation, group therapy, and psycho education in addition to case management for discharge planning. Patient and CSW reviewed pt's identified goals and treatment plan. Pt verbalized understanding and agreed to  treatment plan.    Review of initial/current patient goals per problem list:  1. Goal(s): Patient will participate in aftercare plan   Met: Yes  Target date: 3-5 days post admission date   As evidenced by: Patient will participate within aftercare plan AEB aftercare provider and housing plan at discharge being identified.   10/29/15: Pt plans to return home to her apartment at discharge in [place],  to follow up with outpatient services.     2. Goal (s): Patient will exhibit decreased depressive symptoms and suicidal ideations  Met: Yes  Target date: 3-5 days post admission date   As evidenced by: Patient will utilize self-rating of depression at 3 or below and demonstrate decreased signs of depression or be deemed stable for discharge by MD.   10/29/15: Goal progressing, pt admitted with high levels of depression and SI  10/31/15: Goal met.  Pt rates depression at 0 and denies SI.      3. Goal(s): Patient will demonstrate decreased signs and symptoms of anxiety.   Met: Yes  Target date: 3-5 days post admission date   As evidenced by: Patient will utilize self-rating of anxiety at 3 or below and demonstrated decreased signs of anxiety, or be deemed stable for discharge by MD   10/29/15: Goal progressing, pt admitted with high levels of anxiety  10/31/15: Goal met.  Pt rates anxiety at 1.5.     4. Goal(s): Patient will demonstrate decreased signs of withdrawal due to substance abuse   Met: Yes  Target date: 3-5 days post admission date   As evidenced by: Patient will produce a CIWA/COWS score of 0, have stable  vitals signs, and no symptoms of withdrawal   10/31/15: Goal met. No withdrawal symptoms reported at this time per medical chart     Attendees:  Patient:  Family:  Physician: Dr. Parke Poisson, Dr. Sabra Heck, MD                              10/29/2015 9:30 AM  Nursing: Franco Nones, Darlin Coco, RN     10/29/2015 9:30 AM  Clinical Social Worker:  Marylou Flesher, Hennessey, Su Monks,   10/29/2015 9:30 AM  Clinical Social Worker: Maxie Better, LCSW, Edwyna Shell, LCSW  10/29/2015 9:30 AM  Other: Agustina Caroli, NP       10/29/2015 9:30 AM  Other:                                10/29/2015 9:30 AM  Other:                                10/29/2015 9:30 AM    Alphonse Guild. Megham Dwyer, MSW, LCSWA, LCAS

## 2015-10-29 NOTE — [facility] Suicide Risk Assessment (Signed)
[facility]   Nursing information obtained from:  Patient Demographic factors:  Unemployed Current Mental Status:  NA Loss Factors:  Financial problems / change in socioeconomic status Historical Factors:  NA Risk Reduction Factors:  Positive therapeutic relationship Total Time spent with patient: 45 minutes Principal Problem: Depression, major, recurrent, moderate (HCC) Diagnosis:   Patient Active Problem List   Diagnosis Date Noted  . 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 Depression, major, recurrent, moderate (HCC) [F33.1] 10/29/2015  . Adjustment disorder with mixed anxiety and depressed mood [F43.23] 10/28/2015  . Alcohol use disorder, moderate, dependence (HCC) [F10.20] 10/28/2015     Continued Clinical Symptoms:  Alcohol Use Disorder Identification Test Final Score (AUDIT): 1 The "Alcohol Use Disorders Identification Test", Guidelines for Use in Primary Care, Second Edition.  World Science writerHealth Organization [facility](WHO). Score between 0-7:  no or low risk or alcohol related problems. Score between 8-15:  moderate risk of alcohol related problems. Score between 16-19:  high risk of alcohol related problems. Score 20 or above:  warrants further diagnostic evaluation for alcohol dependence and treatment.   CLINICAL FACTORS:   Depression:   Comorbid alcohol abuse/dependence Alcohol/Substance Abuse/Dependencies    Psychiatric Specialty Exam: Physical Exam  ROS  Blood pressure 116/73, pulse 127, temperature 98.2 F (36.8 C), temperature source Oral, resp. rate 18, height 5\' 5"  (1.651 m), weight 58.514 kg (129 lb), SpO2 99 %.Body mass index is 21.47 kg/(m^2).    COGNITIVE FEATURES THAT CONTRIBUTE TO RISK:  Closed-mindedness, Polarized thinking and Thought constriction (tunnel vision)    SUICIDE RISK:   Mild:  Suicidal ideation of limited frequency, intensity, duration, and specificity.  There are no identifiable  plans, no associated intent, mild dysphoria and related symptoms, good self-control (both objective and subjective assessment), few other risk factors, and identifiable protective factors, including available and accessible social support.  PLAN OF CARE: see admission H and P  Medical Decision Making:  Review of Psycho-Social Stressors (1), Review or order clinical lab tests (1), Review of Medication Regimen & Side Effects (2) and Review of New Medication or Change in Dosage (2)  I certify that inpatient services furnished can reasonably be expected to improve the patient's condition.   Muhanad Torosyan A 10/29/2015, 5:13 PM

## 2015-10-29 NOTE — BH Assessment (Signed)
Patient is an 54 year old female who is admitted to the unit for reports of toothache and suicidal ideation.  Patient currently denies suicidal ideation, auditory and visual hallucinations.  Patient states "I don't have any plan to kill myself."  Patient is alert and oriented to person, place and time.  Patient states that she need help and medication that will stop her from drinking.   Patient presents with a flat affect and depressed mood.  Denies prior history of suicide attempts.  Patient oriented to the unit, staff and room.  Belongings at bedside.  Safety checks initiated and maintained.

## 2015-10-29 NOTE — Tx Team (Signed)
Initial Interdisciplinary Treatment Plan   PATIENT STRESSORS: Financial difficulties Health problems Medication change or noncompliance   PATIENT STRENGTHS: Ability for insight Average or above average intelligence Capable of independent living Communication skills Supportive family/friends   PROBLEM LIST: Problem List/Patient Goals Date to be addressed Date deferred Reason deferred Estimated date of resolution  "I need medication to prevent me from drinking" 10/29/15           "take care of my toothache" 10/29/15           depression 10/29/15           Suicidal Ideation 10/29/15                  DISCHARGE CRITERIA:  Ability to meet basic life and health needs Motivation to continue treatment in a less acute level of care Verbal commitment to aftercare and medication compliance  PRELIMINARY DISCHARGE PLAN: Attend aftercare/continuing care group Attend PHP/IOP Outpatient therapy Return to previous living arrangement  PATIENT/FAMIILY INVOLVEMENT: This treatment plan has been presented to and reviewed with the patient, Rolley SimsLisa Short.  The patient and family have been given the opportunity to ask questions and make suggestions.  Mickie Baillizabeth O Iwenekha 10/29/2015, 3:11 AM
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
## 2015-10-29 NOTE — Progress Notes (Signed)
Adult Psychoeducational Group Note  Date:  10/29/2015 Time:  10:49 PM  Group Topic/Focus:  Wrap-Up Group:   The focus of this group is to help patients review their daily goal of treatment and discuss progress on daily workbooks.  Participation Level:  Active  Participation Quality:  Appropriate  Affect:  Appropriate  Cognitive:  Alert  Insight: Appropriate  Engagement in Group:  Engaged  Modes of Intervention:  Discussion  Additional Comments:  Pt stated that today was exhausting and she didn't get much done. She really wants to get her life together.   Kaleen OdeaCOOKE, Slaton Reaser R 10/29/2015, 10:49 PM

## 2015-10-29 NOTE — [facility] Group Notes (Signed)
[facility] LCSW Aftercare Discharge Planning Group Note  10/29/2015  8:45 AM  Participation Quality: Did Not Attend. Patient invited to participate but declined.  Shaneil Yazdi, MSW, LCSWA Clinical Social Worker [facility] [phone]   
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
## 2015-10-30 MED ORDER — NICOTINE 21 MG/24HR TD PT24
21.0000 mg | MEDICATED_PATCH | Freq: Every day | TRANSDERMAL | Status: DC
  Administered 2015-10-30 – 2015-10-31 (×2): 21 mg via TRANSDERMAL
  Filled 2015-10-30 (×5): qty 1

## 2015-10-30 MED ORDER — FLUOXETINE HCL 20 MG PO CAPS
20.0000 mg | ORAL_CAPSULE | Freq: Every day | ORAL | Status: DC
  Administered 2015-10-31: 20 mg via ORAL
  Filled 2015-10-30 (×3): qty 1

## 2015-10-30 NOTE — Progress Notes (Signed)
D: Patient presents with sad, depressed mood; her affect is flat.  Patient remains isolative to room.  She denies any withdrawal symptoms.  She rates her depression as a 2; anxiety as a 2 and hopelessness as a 1.  Patient is currently homeless and is not sure where she will go upon discharge.  Her main complaint is a toothache.  Her goal today is to work on "discharge."  She denies any SI/HI/AVH. A: Continue to monitor medication management and MD orders.  Safety checks completed every 15 minutes per protocol.  Offer support and encouragement as needed. R: Patient is quiet and pleasant.

## 2015-10-30 NOTE — Progress Notes (Signed)
D: Patient alert and oriented x 4. Patient denies SI/HI/AVH. Patient states she is having some slight pain because of a tooth that she had received pain medication for earlier. Patient states she is here detox off alcohol, that she used to live with her daughter but she has since moved. Patient states she really started drinking really heavy to numb the pain in her mouth because of an abscess tooth.  A: Staff to monitor Q 15 mins for safety. Encouragement and support offered. Scheduled medications administered per orders. R: Patient remains safe on the unit. Patient attended group tonight. Patient visible on the unit.

## 2015-10-30 NOTE — Progress Notes (Signed)
[facility] MD Progress Note  10/30/2015 3:05 PM Amanda Huff  MRN:  [id number] Subjective:  Reports that today she is feeling " a little better", but still depressed. Denies any suicidal ideations. Denies medication side effects. Reports tooth pain . Objective: Case discussed with treatment team and patient seen- chart notes reviewed. Patient remains depressed, constricted and briefly tearful during interview, but denies any suicidal ideations, and also denies any psychotic symptoms. She describes a history of alcohol abuse, in binges- at this time no tremors , no diaphoresis , no acute distress. No disruptive or agitated behaviors on unit. Some group participation, but has tended towards isolating in room. Denies medication side effects. Principal Problem: Depression, major, recurrent, moderate (HCC) Diagnosis:   Patient Active Problem List   Diagnosis Date Noted  . 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 Depression, major, recurrent, moderate ([facility]) [F33.1] 10/29/2015  . Alcohol use disorder, moderate, dependence ([facility]) [F10.20] 10/28/2015   Total Time spent with patient: 20 minutes    Past Medical History: History reviewed. No pertinent past medical history.  Past Surgical History  Procedure Laterality Date  . Cesarean section     Family History: History reviewed. No pertinent family history.  Social History:  History  Alcohol Use  . Yes    Comment: occasionally     History  Drug Use No    Social History   Social History  . Marital Status: Single    Spouse Name: N/A  . Number of Children: N/A  . Years of Education: N/A   Social History Main Topics  . Smoking status: Current Every Day Smoker -- 0.50 packs/day    Types: Cigarettes  . Smokeless tobacco: None  . Alcohol Use: Yes     Comment: occasionally  . Drug Use: No  . Sexual Activity: No   Other Topics Concern  . None   Social History Narrative   Additional Social History:    Sleep: Fair  Appetite:  Fair  Current Medications: Current Facility-Administered Medications  Medication Dose Route Frequency Provider Last Rate Last Dose  . acetaminophen (TYLENOL) tablet 650 mg  650 mg Oral Q6H PRN Kerry Hough, PA-C   650 mg at 10/29/15 4098  . alum & mag hydroxide-simeth (MAALOX/MYLANTA) 200-200-20 MG/5ML suspension 30 mL  30 mL Oral Q4H PRN Kerry Hough, PA-C      . benzocaine (ORAJEL) 10 % mucosal gel   Mouth/Throat QID PRN Rachael Fee, MD      . FLUoxetine (PROZAC) capsule 10 mg  10 mg Oral Daily Rachael Fee, MD   10 mg at 10/30/15 1191  . hydrOXYzine (ATARAX/VISTARIL) tablet 25 mg  25 mg Oral Q4H PRN Kerry Hough, PA-C      . ibuprofen (ADVIL,MOTRIN) tablet 800 mg  800 mg Oral Q6H PRN Rachael Fee, MD   800 mg at 10/30/15 4782  . loperamide (IMODIUM) capsule 2-4 mg  2-4 mg Oral PRN Kerry Hough, PA-C      . LORazepam (ATIVAN) tablet 1 mg  1 mg Oral Q6H PRN Kerry Hough, PA-C      . LORazepam (ATIVAN) tablet 1 mg  1 mg Oral TID Kerry Hough, PA-C   1 mg at 10/30/15 1138   Followed by  . [START ON 10/31/2015] LORazepam (ATIVAN) tablet 1 mg  1 mg Oral BID Kerry Hough, PA-C       Followed by  . [START ON 11/01/2015] LORazepam (ATIVAN) tablet  1 mg  1 mg Oral Daily Kerry HoughSpencer E Simon, PA-C   1 mg at 10/30/15 [id number]  . magnesium hydroxide (MILK OF MAGNESIA) suspension 30 mL  30 mL Oral Daily PRN Kerry HoughSpencer E Simon, PA-C      . multivitamin with minerals tablet 1 tablet  1 tablet Oral Daily Kerry HoughSpencer E Simon, PA-C   1 tablet at 10/30/15 [id number]  . nicotine (NICODERM CQ - dosed in mg/24 hours) patch 21 mg  21 mg Transdermal Daily Craige CottaFernando A Cobos, MD   21 mg at 10/30/15 1451  . ondansetron (ZOFRAN-ODT) disintegrating tablet 4 mg  4 mg Oral Q6H PRN Kerry HoughSpencer E Simon, PA-C      . thiamine (VITAMIN B-1) tablet 100 mg  100 mg Oral Daily Kerry HoughSpencer E Simon, PA-C   100 mg at 10/30/15 [id number]  . traZODone (DESYREL) tablet 50 mg  50 mg Oral QHS,MR X 1 Kerry HoughSpencer E Simon, PA-C    50 mg at 10/29/15 2137    Lab Results: No results found for this or any previous visit (from the past 48 hour(s)).  Physical Findings: AIMS: Facial and Oral Movements Muscles of Facial Expression: None, normal Lips and Perioral Area: None, normal Jaw: None, normal Tongue: None, normal,Extremity Movements Upper (arms, wrists, hands, fingers): None, normal Lower (legs, knees, ankles, toes): None, normal, Trunk Movements Neck, shoulders, hips: None, normal, Overall Severity Severity of abnormal movements (highest score from questions above): None, normal Incapacitation due to abnormal movements: None, normal Patient's awareness of abnormal movements (rate only patient's report): No Awareness, Dental Status Current problems with teeth and/or dentures?: Yes Does patient usually wear dentures?: No  CIWA:  CIWA-Ar Total: 0 COWS:     Musculoskeletal: Strength & Muscle Tone: within normal limits- no tremors, no diaphoresis, no psychomotor agitation Gait & Station: normal Patient leans: N/A  Psychiatric Specialty Exam: ROS denies chest pain, no SOB, no vomiting, reports tooth ache  Blood pressure 128/85, pulse 128, temperature 98.2 F (36.8 C), temperature source Oral, resp. rate 18, height 5\' 5"  (1.651 m), weight 129 lb (58.514 kg), SpO2 99 %.Body mass index is 21.47 kg/(m^2).  General Appearance: Fairly Groomed  Patent attorneyye Contact::  Good  Speech:  Normal Rate  Volume:  Normal  Mood:  Depressed, but states better today  Affect:  Constricted, but does smile at times appropriately  Thought Process:  Linear  Orientation:  Other:  fully alert and attentive   Thought Content:  denies hallucinations, no delusions, ruminative about psychosocial losses/ stressors  Suicidal Thoughts:  No today denies any suicidal plan or intention and contracts for safety on the unit   Homicidal Thoughts:  No  Memory:  recent and remote grossly intact  Judgement:  Fair  Insight:  Fair  Psychomotor Activity:   Normal- no tremors, no diaphoresis, no restlessness   Concentration:  Good  Recall:  Good  Fund of Knowledge:Good  Language: Good  Akathisia:  Negative  Handed:  Right  AIMS (if indicated):     Assets:  Communication Skills Desire for Improvement Resilience  ADL's:  Fair   Cognition: WNL  Sleep:     Assessment - 54 year old female, presented with depression and alcohol abuse, mostly in binges. Today still depressed, constricted, ruminative about stressors, but stating she feels she is improving. She denies any suicidal plan or intention and contracts for safety on the unit at this time. At this time not presenting with alcohol WDL symptoms.  Treatment Plan Summary: Daily contact with patient to assess  and evaluate symptoms and progress in treatment, Medication management, Plan inpatient admission and medications as below Continue to encourage increased Milieu/ Group participation to work on coping skills and symptom reduction Continue to encourage efforts to work on early recovery and relapse prevention , sobriety. Continue Prozac at 20 mgrs QDAY for depression Continue Ativan detox protocol to minimize risk of alcohol WDL Continue Trazodone 50 mgrs QHS for insomnia as needed  Continue Orajel to affected tooth for pain control  COBOS, FERNANDO 10/30/2015, 3:05 PM

## 2015-10-30 NOTE — [facility] Group Notes (Signed)
[facility] LCSW Group Therapy 10/30/2015  1:15 PM   Type of Therapy: Group Therapy  Participation Level: Did Not Attend. Patient invited to participate but declined.   Cleotilde Spadaccini, MSW, LCSWA Clinical Social Worker [facility] [phone]   

## 2015-10-31 MED ORDER — FLUOXETINE HCL 20 MG PO CAPS: 20.0000 mg | ORAL_CAPSULE | Freq: Every day | ORAL | Status: DC

## 2015-10-31 MED ORDER — NICOTINE 21 MG/24HR TD PT24: 21.0000 mg | MEDICATED_PATCH | Freq: Every day | TRANSDERMAL | Status: DC

## 2015-10-31 MED ORDER — TRAZODONE HCL 50 MG PO TABS: 50.0000 mg | ORAL_TABLET | Freq: Every evening | ORAL | Status: DC | PRN

## 2015-10-31 MED ORDER — BENZOCAINE 10 % MT GEL: Freq: Four times a day (QID) | OROMUCOSAL | Status: DC | PRN

## 2015-10-31 MED ORDER — HYDROXYZINE PAMOATE 25 MG PO CAPS: 25.0000 mg | ORAL_CAPSULE | ORAL | Status: DC | PRN

## 2015-10-31 NOTE — Progress Notes (Signed)
D: Patient alert and oriented x 4. Patient denies SI/HI/AVH. Patient reported having some dental pain on his right side of mouth. Patient rated pain 8/10. PRN Ibuprofen was given at 2125 with effective results.  A: Staff to monitor Q 15 mins for safety. Encouragement and support offered. Scheduled medications administered per orders. R: Patient remains safe on the unit. Patient attended group tonight. Patient visible on hte unit and interacting with peers. Patient taking administered medications.

## 2015-10-31 NOTE — [facility] Group Notes (Signed)
[facility] LCSW Group Therapy 10/31/2015  1:15 PM   Type of Therapy: Group Therapy  Participation Level: Did Not Attend. Patient invited to participate but declined.   Samuella BruinKristin Delta Deshmukh, MSW, [employer] Inc[employer]CSWA Clinical Social Worker [facility] [phone]

## 2015-10-31 NOTE — BHH Suicide Risk Assessment (Signed)
[facility] Discharge Suicide Risk Assessment   Demographic Factors:  Caucasian  Total Time spent with patient: 30 minutes  Musculoskeletal: Strength & Muscle Tone: within normal limits Gait & Station: normal Patient leans: normal  Psychiatric Specialty Exam: Physical Exam  Review of Systems  Constitutional: Negative.   HENT: Negative.   Eyes: Negative.   Respiratory: Negative.   Cardiovascular: Negative.   Gastrointestinal: Negative.   Genitourinary: Negative.   Musculoskeletal: Negative.   Skin: Negative.   Neurological: Negative.   Endo/Heme/Allergies: Negative.   Psychiatric/Behavioral: Positive for depression and substance abuse.    Blood pressure 135/82, pulse 108, temperature 98.1 F (36.7 C), temperature source Oral, resp. rate 16, height  (1.651 m), weight 58.514 kg (129 lb), SpO2 99 %.Body mass index is 21.47 kg/(m^2).  General Appearance: Fairly Groomed  Patent attorney::  Fair  Speech:  Clear and Coherent409  Volume:  Normal  Mood:  Euthymic  Affect:  Appropriate  Thought Process:  Coherent and Goal Directed  Orientation:  Full (Time, Place, and Person)  Thought Content:  plans as she moves on, relapse prevention plan  Suicidal Thoughts:  No  Homicidal Thoughts:  No  Memory:  Immediate;   Fair Recent;   Fair Remote;   Fair  Judgement:  Fair  Insight:  Present  Psychomotor Activity:  Normal  Concentration:  Fair  Recall:  [employer] of Knowledge:Fair  Language: Fair  Akathisia:  No  Handed:  Right  AIMS (if indicated):     Assets:  Desire for Improvement Housing Social Support  Sleep:  Number of Hours: 6.75  Cognition: WNL  ADL's:  Intact   Have you used any form of tobacco in the last 30 days? (Cigarettes, Smokeless Tobacco, Cigars, and/or Pipes): Yes  Has this patient used any form of tobacco in the last 30 days? (Cigarettes, Smokeless Tobacco, Cigars, and/or Pipes) Yes, A prescription for an FDA-approved tobacco cessation medication was offered at  discharge and the patient refused  Mental Status Per Nursing Assessment::   On Admission:  NA  Current Mental Status by Physician: IN full contact with reality. There are no active SI plans or intent. She states she knows she should have handled the stress differently. States she had support but she did not access it. States she is ready to be D/C today, go back to her apartment and start cleaning it, putting it in order. States she is ready to do those things. She is going to be more proactive in trying to find a car, get connected with a church, go to AA, communicate with the case manager that is helping her with the transition from [employer] into her own apartment   Loss Factors: NA  Historical Factors: NA  Risk Reduction Factors:   Positive social support  Continued Clinical Symptoms:  Depression:   Comorbid alcohol abuse/dependence Alcohol/Substance Abuse/Dependencies  Cognitive Features That Contribute To Risk:  Closed-mindedness, Polarized thinking and Thought constriction (tunnel vision)    Suicide Risk:  Minimal: No identifiable suicidal ideation.  Patients presenting with no risk factors but with morbid ruminations; may be classified as minimal risk based on the severity of the depressive symptoms  Principal Problem: Depression, major, recurrent, moderate (HCC) Discharge Diagnoses:  Patient Active Problem List   Diagnosis Date Noted  . 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 Depression, major, recurrent, moderate (HCC) [F33.1] 10/29/2015  . Alcohol use disorder, moderate, dependence (HCC) [F10.20] 10/28/2015    Follow-up Information  [facility]   Specialty:  Professional Counselor   Why:  Walk-in clinic Monday through Friday from 8am to 12pm for assessment for therapy and medication management services   Contact information:   [facility] [address] [place] [address] [phone]       Plan Of Care/Follow-up recommendations:  Activity:  as tolerated Diet:  regular [facility] as above Is patient on multiple antipsychotic therapies at discharge:  No   Has Patient had three or more failed trials of antipsychotic monotherapy by history:  No  Recommended Plan for Multiple Antipsychotic Therapies: NA    Lovenia Debruler A 10/31/2015, 1:08 PM

## 2015-10-31 NOTE — Progress Notes (Signed)
Patient did attend the evening karaoke group. Pt was attentive and supportive but did not participate by singing a song.   

## 2015-10-31 NOTE — Progress Notes (Signed)
Patient verbalizes readiness for discharge. Anxious to return home. Follow up plan explained, Rx's given along with sample meds. Patient verbalizes understanding. Denies SI/HI and assures this Clinical research associatewriter she will seek assistance should that status change. Patient discharged ambulatory, in stable condition via [employer] service. Lawrence MarseillesFriedman, Ceceilia Cephus Eakes

## 2015-10-31 NOTE — BHH Group Notes (Signed)
   [facility] Note  10/31/2015  8:45 AM   Participation Quality: Alert, Appropriate and Oriented  Mood/Affect: Appropriate  Depression Rating: 1.5  Anxiety Rating: Reports low levels of anxiety related to wanting to return home  Thoughts of Suicide: Pt denies SI/HI  Will you contract for safety? Yes  Current AVH: Pt denies  Plan for Discharge/Comments: Pt attended discharge planning group and actively participated in group. CSW provided pt with today's workbook. Patient reports feeling "wonderful" today and that she is hoping to discharge this afternoon. She [facility].  Transportation Means: Pt reports access to transportation  Supports: No supports mentioned at this time  Samuella BruinKristin Amando Ishikawa, MSW, [employer]CSWA Clinical Social Worker [facility] [phone]
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
## 2015-10-31 NOTE — Progress Notes (Signed)
  [facility] Dis[facility] :  Will you be returning to the same living situation after discharge:  Yes,  pt will return [place], [place] At discharge, do you have transportation home?: Yes,  pt provided with taxi voucher Do you have the ability to pay for your medications: Yes,  pt will be provided with prescriptions at discharge  Release of information consent forms completed and in the chart;  Patient's signature needed at discharge.  Patient to Follow up at: Follow-up Information    [facility]   Specialty:  Professional Counselor   Why:  Walk-in clinic Monday through Friday from 8am to 12pm for assessment for therapy and medication management services   Contact information:   [facility] [address] [place] [address] [phone]       Next level of care provider has access to [facility] Link:no  Patient denies SI/HI: Yes,  pt denies    Safety Planning and Suicide Prevention discussed: Yes,  with patient  Have you used any form of tobacco in the last 30 days? (Cigarettes, Smokeless Tobacco, Cigars, and/or Pipes): Yes  Has patient been referred to the Quitline?: Patient refused referral  Mercy RidingJonathan F Devlynn Knoff 10/31/2015, 12:36 PM

## 2015-11-10 ENCOUNTER — Encounter (HOSPITAL_COMMUNITY): Payer: Self-pay

## 2015-11-10 ENCOUNTER — Emergency Department (HOSPITAL_COMMUNITY)
Admission: EM | Admit: 2015-11-10 | Discharge: 2015-11-11 | Disposition: A | Discharge status: Other Acute Inpatient Hospital | Payer: Federal, State, Local not specified - Other | Attending: Emergency Medicine | Admitting: Emergency Medicine

## 2015-11-10 DIAGNOSIS — R000 Tachycardia, unspecified: Secondary | ICD-10-CM | POA: Insufficient documentation

## 2015-11-10 DIAGNOSIS — F17210 Nicotine dependence, cigarettes, uncomplicated: Secondary | ICD-10-CM | POA: Insufficient documentation

## 2015-11-10 DIAGNOSIS — F329 Major depressive disorder, single episode, unspecified: Secondary | ICD-10-CM | POA: Insufficient documentation

## 2015-11-10 DIAGNOSIS — F331 Major depressive disorder, recurrent, moderate: Secondary | ICD-10-CM | POA: Diagnosis present

## 2015-11-10 DIAGNOSIS — Z79899 Other long term (current) drug therapy: Secondary | ICD-10-CM | POA: Insufficient documentation

## 2015-11-10 DIAGNOSIS — F1020 Alcohol dependence, uncomplicated: Secondary | ICD-10-CM | POA: Diagnosis present

## 2015-11-10 DIAGNOSIS — Z008 Encounter for other general examination: Secondary | ICD-10-CM | POA: Insufficient documentation

## 2015-11-10 DIAGNOSIS — F1014 Alcohol abuse with alcohol-induced mood disorder: Secondary | ICD-10-CM | POA: Diagnosis present

## 2015-11-10 HISTORY — DX: Alcohol abuse, uncomplicated: F10.10

## 2015-11-10 LAB — COMPREHENSIVE METABOLIC PANEL
ALBUMIN: 4.2 g/dL (ref 3.5–5.0)
ALK PHOS: 132 U/L — AB (ref 38–126)
ALT: 28 U/L (ref 14–54)
ANION GAP: 22 — AB (ref 5–15)
AST: 43 U/L — AB (ref 15–41)
BILIRUBIN TOTAL: 1 mg/dL (ref 0.3–1.2)
BUN: 11 mg/dL (ref 6–20)
CALCIUM: 8.6 mg/dL — AB (ref 8.9–10.3)
CO2: 20 mmol/L — ABNORMAL LOW (ref 22–32)
Chloride: 99 mmol/L — ABNORMAL LOW (ref 101–111)
Creatinine, Ser: 0.67 mg/dL (ref 0.44–1.00)
GFR calc Af Amer: >60 mL/min (ref >60)
GLUCOSE: 85 mg/dL (ref 65–99)
Potassium: 3.9 mmol/L (ref 3.5–5.1)
Sodium: 141 mmol/L (ref 135–145)
TOTAL PROTEIN: 7.1 g/dL (ref 6.5–8.1)

## 2015-11-10 LAB — RAPID URINE DRUG SCREEN, HOSP PERFORMED
Amphetamines: NOT DETECTED
Barbiturates: NOT DETECTED
Benzodiazepines: NOT DETECTED
Cocaine: NOT DETECTED
OPIATES: NOT DETECTED
Tetrahydrocannabinol: NOT DETECTED

## 2015-11-10 LAB — CBC
HCT: 41.1 % (ref 36.0–46.0)
Hemoglobin: 14.5 g/dL (ref 12.0–15.0)
MCH: 31.5 pg (ref 26.0–34.0)
MCHC: 35.3 g/dL (ref 30.0–36.0)
MCV: 89.2 fL (ref 78.0–100.0)
PLATELETS: 288 10*3/uL (ref 150–400)
RBC: 4.61 MIL/uL (ref 3.87–5.11)
RDW: 17.6 % — ABNORMAL HIGH (ref 11.5–15.5)
WBC: 5.6 10*3/uL (ref 4.0–10.5)

## 2015-11-10 LAB — ETHANOL: Alcohol, Ethyl (B): 408 mg/dL (ref <5)

## 2015-11-10 MED ORDER — VITAMIN B-1 100 MG PO TABS
100.0000 mg | ORAL_TABLET | Freq: Every day | ORAL | Status: DC
  Administered 2015-11-11 (×2): 100 mg via ORAL
  Filled 2015-11-10 (×2): qty 1

## 2015-11-10 MED ORDER — LORAZEPAM 1 MG PO TABS
0.0000 mg | ORAL_TABLET | Freq: Four times a day (QID) | ORAL | Status: DC
  Administered 2015-11-11: 2 mg via ORAL
  Administered 2015-11-11 (×2): 1 mg via ORAL
  Filled 2015-11-10: qty 1
  Filled 2015-11-10: qty 2
  Filled 2015-11-10: qty 1

## 2015-11-10 MED ORDER — LORAZEPAM 1 MG PO TABS: 0.0000 mg | ORAL_TABLET | Freq: Two times a day (BID) | ORAL | Status: DC

## 2015-11-10 MED ORDER — THIAMINE HCL 100 MG/ML IJ SOLN: 100.0000 mg | Freq: Every day | INTRAMUSCULAR | Status: DC

## 2015-11-10 NOTE — ED Notes (Signed)
Pt has 1 personal belongings bag behind nurses station.

## 2015-11-10 NOTE — ED Notes (Signed)
Pt sts she wants go home!

## 2015-11-10 NOTE — ED Notes (Signed)
According to EMS, they were notified by a Child psychotherapistocial Worker; Tanya Clinard, to complete a medical clearance for this pt. [facility], speaking sporadically, anxious, and smelling of alcohol.   EMS Vitals  BP 134/68 HR 96  SPO2 98% RA CBG 86

## 2015-11-10 NOTE — ED Notes (Signed)
ETOH 408 per Amy from lab.

## 2015-11-10 NOTE — ED Provider Notes (Signed)
CSN: [id number]     Arrival date & time 11/10/15  2006 History   First MD Initiated Contact with Patient 11/10/15 2110     Chief Complaint  Patient presents with  . Medical Clearance     (Consider location/radiation/quality/duration/timing/severity/associated sxs/prior Treatment) HPI Comments: Patient here complaining of increased depression without suicidal or homicidal ideations. Patient states that her last drink was 2 days ago. Denies any current ingestions. Patient denies any abdominal pain. No black or bloody stools. Told her friend that she was more depressed and EMS presents the patient this time.  The history is provided by the patient.    No past medical history on file. Past Surgical History  Procedure Laterality Date  . Cesarean section     No family history on file. Social History  Substance Use Topics  . Smoking status: Current Every Day Smoker -- 0.50 packs/day    Types: Cigarettes  . Smokeless tobacco: Not on file  . Alcohol Use: Yes     Comment: occasionally   OB History    No data available     Review of Systems  All other systems reviewed and are negative.     Allergies  Review of patient's allergies indicates no known allergies.  Home Medications   Prior to Admission medications   Medication Sig Start Date End Date Taking? Authorizing Provider  benzocaine (ORAJEL) 10 % mucosal gel Use as directed in the mouth or throat 4 (four) times daily as needed for mouth pain. 10/31/15   Beau Fanny, FNP  FLUoxetine (PROZAC) 20 MG capsule Take 1 capsule (20 mg total) by mouth daily. 10/31/15   Beau Fanny, FNP  hydrOXYzine (VISTARIL) 25 MG capsule Take 1 capsule (25 mg total) by mouth every 4 (four) hours as needed for anxiety. 10/31/15   Beau Fanny, FNP  nicotine (NICODERM CQ - DOSED IN MG/24 HOURS) 21 mg/24hr patch Place 1 patch (21 mg total) onto the skin daily. 10/31/15   Beau Fanny, FNP  traZODone (DESYREL) 50 MG tablet Take 1 tablet (50 mg  total) by mouth at bedtime as needed for sleep. 10/31/15   Everardo All Withrow, FNP   BP 145/101 mmHg  Pulse 134  Temp(Src) 98 F (36.7 C) (Oral)  Resp 21  SpO2 92%  LMP  (Approximate) Physical Exam  Constitutional: She is oriented to person, place, and time. She appears well-developed and well-nourished.  Non-toxic appearance. No distress.  HENT:  Head: Normocephalic and atraumatic.  Eyes: Conjunctivae, EOM and lids are normal. Pupils are equal, round, and reactive to light.  Neck: Normal range of motion. Neck supple. No tracheal deviation present. No thyroid mass present.  Cardiovascular: Regular rhythm and normal heart sounds.  Tachycardia present.  Exam reveals no gallop.   No murmur heard. Pulmonary/Chest: Effort normal and breath sounds normal. No stridor. No respiratory distress. She has no decreased breath sounds. She has no wheezes. She has no rhonchi. She has no rales.  Abdominal: Soft. Normal appearance and bowel sounds are normal. She exhibits no distension. There is no tenderness. There is no rebound and no CVA tenderness.  Musculoskeletal: Normal range of motion. She exhibits no edema or tenderness.  Neurological: She is alert and oriented to person, place, and time. She has normal strength. No cranial nerve deficit or sensory deficit. GCS eye subscore is 4. GCS verbal subscore is 5. GCS motor subscore is 6.  Skin: Skin is warm and dry. No abrasion and no rash noted.  Psychiatric: Her affect is blunt. Her speech is delayed. She is slowed.  Nursing note and vitals reviewed.   ED Course  Procedures (including critical care time) Labs Review Labs Reviewed  COMPREHENSIVE METABOLIC PANEL - Abnormal; Notable for the following:    Chloride 99 (*)    CO2 20 (*)    Calcium 8.6 (*)    AST 43 (*)    Alkaline Phosphatase 132 (*)    Anion gap 22 (*)    All other components within normal limits  ETHANOL - Abnormal; Notable for the following:    Alcohol, Ethyl (B) 408 (*)    All  other components within normal limits  CBC - Abnormal; Notable for the following:    RDW 17.6 (*)    All other components within normal limits  URINE RAPID DRUG SCREEN, HOSP PERFORMED    Imaging Review No results found. I have personally reviewed and evaluated these images and lab results as part of my medical decision-making.   EKG Interpretation None      MDM   Final diagnoses:  None   patient was sober up and then be seen by psychiatry in the morning    Lorre NickAnthony Valma Rotenberg, MD 11/10/15 2242

## 2015-11-11 ENCOUNTER — Inpatient Hospital Stay (HOSPITAL_COMMUNITY)
Admission: AD | Admit: 2015-11-11 | Discharge: 2015-11-24 | DRG: 897 | Disposition: A | Discharge status: Home / Self Care | Payer: Federal, State, Local not specified - Other | Source: Intra-hospital | Attending: Psychiatry | Admitting: Psychiatry

## 2015-11-11 ENCOUNTER — Encounter (HOSPITAL_COMMUNITY): Payer: Self-pay

## 2015-11-11 DIAGNOSIS — F1024 Alcohol dependence with alcohol-induced mood disorder: Secondary | ICD-10-CM | POA: Diagnosis present

## 2015-11-11 DIAGNOSIS — F331 Major depressive disorder, recurrent, moderate: Secondary | ICD-10-CM | POA: Diagnosis not present

## 2015-11-11 DIAGNOSIS — G4700 Insomnia, unspecified: Secondary | ICD-10-CM | POA: Diagnosis present

## 2015-11-11 DIAGNOSIS — F1020 Alcohol dependence, uncomplicated: Secondary | ICD-10-CM | POA: Diagnosis present

## 2015-11-11 DIAGNOSIS — R748 Abnormal levels of other serum enzymes: Secondary | ICD-10-CM | POA: Diagnosis present

## 2015-11-11 DIAGNOSIS — R45851 Suicidal ideations: Secondary | ICD-10-CM | POA: Diagnosis not present

## 2015-11-11 DIAGNOSIS — F17210 Nicotine dependence, cigarettes, uncomplicated: Secondary | ICD-10-CM | POA: Diagnosis present

## 2015-11-11 DIAGNOSIS — F1014 Alcohol abuse with alcohol-induced mood disorder: Secondary | ICD-10-CM | POA: Diagnosis present

## 2015-11-11 DIAGNOSIS — F332 Major depressive disorder, recurrent severe without psychotic features: Secondary | ICD-10-CM | POA: Diagnosis not present

## 2015-11-11 LAB — ETHANOL: ALCOHOL ETHYL (B): 31 mg/dL — AB (ref <5)

## 2015-11-11 MED ORDER — ALUM & MAG HYDROXIDE-SIMETH 200-200-20 MG/5ML PO SUSP: 30.0000 mL | ORAL | Status: DC | PRN

## 2015-11-11 MED ORDER — MAGNESIUM HYDROXIDE 400 MG/5ML PO SUSP
30.0000 mL | Freq: Every day | ORAL | Status: DC | PRN
  Administered 2015-11-16: 30 mL via ORAL
  Filled 2015-11-11: qty 30

## 2015-11-11 MED ORDER — LORAZEPAM 1 MG PO TABS
1.0000 mg | ORAL_TABLET | Freq: Every day | ORAL | Status: AC
  Administered 2015-11-15: 1 mg via ORAL
  Filled 2015-11-11: qty 1

## 2015-11-11 MED ORDER — IBUPROFEN 200 MG PO TABS
400.0000 mg | ORAL_TABLET | Freq: Once | ORAL | Status: AC
  Administered 2015-11-11: 400 mg via ORAL
  Filled 2015-11-11: qty 2

## 2015-11-11 MED ORDER — TRAZODONE HCL 100 MG PO TABS
100.0000 mg | ORAL_TABLET | Freq: Every day | ORAL | Status: DC
  Administered 2015-11-11 – 2015-11-15 (×4): 100 mg via ORAL
  Filled 2015-11-11 (×8): qty 1

## 2015-11-11 MED ORDER — HYDROXYZINE HCL 25 MG PO TABS
25.0000 mg | ORAL_TABLET | Freq: Four times a day (QID) | ORAL | Status: AC | PRN
  Administered 2015-11-13: 25 mg via ORAL
  Filled 2015-11-11 (×2): qty 1

## 2015-11-11 MED ORDER — ADULT MULTIVITAMIN W/MINERALS CH
1.0000 | ORAL_TABLET | Freq: Every day | ORAL | Status: DC
  Administered 2015-11-11 – 2015-11-24 (×14): 1 via ORAL
  Filled 2015-11-11 (×18): qty 1

## 2015-11-11 MED ORDER — ONDANSETRON 4 MG PO TBDP: 4.0000 mg | ORAL_TABLET | Freq: Four times a day (QID) | ORAL | Status: AC | PRN

## 2015-11-11 MED ORDER — TRAZODONE HCL 100 MG PO TABS: 100.0000 mg | ORAL_TABLET | Freq: Every day | ORAL | Status: DC

## 2015-11-11 MED ORDER — LORAZEPAM 1 MG PO TABS: 0.0000 mg | ORAL_TABLET | Freq: Four times a day (QID) | ORAL | Status: DC

## 2015-11-11 MED ORDER — LORAZEPAM 1 MG PO TABS
1.0000 mg | ORAL_TABLET | Freq: Four times a day (QID) | ORAL | Status: AC
  Administered 2015-11-11 – 2015-11-12 (×6): 1 mg via ORAL
  Filled 2015-11-11 (×6): qty 1

## 2015-11-11 MED ORDER — LORAZEPAM 1 MG PO TABS
1.0000 mg | ORAL_TABLET | Freq: Three times a day (TID) | ORAL | Status: AC
  Administered 2015-11-13 (×3): 1 mg via ORAL
  Filled 2015-11-11 (×3): qty 1

## 2015-11-11 MED ORDER — THIAMINE HCL 100 MG/ML IJ SOLN: 100.0000 mg | Freq: Every day | INTRAMUSCULAR | Status: DC

## 2015-11-11 MED ORDER — LORAZEPAM 1 MG PO TABS
1.0000 mg | ORAL_TABLET | Freq: Two times a day (BID) | ORAL | Status: AC
  Administered 2015-11-14 (×2): 1 mg via ORAL
  Filled 2015-11-11 (×2): qty 1

## 2015-11-11 MED ORDER — FLUOXETINE HCL 20 MG PO CAPS
20.0000 mg | ORAL_CAPSULE | Freq: Every day | ORAL | Status: DC
  Administered 2015-11-12 – 2015-11-21 (×8): 20 mg via ORAL
  Filled 2015-11-11 (×12): qty 1

## 2015-11-11 MED ORDER — VITAMIN B-1 100 MG PO TABS
100.0000 mg | ORAL_TABLET | Freq: Every day | ORAL | Status: DC
  Administered 2015-11-12 – 2015-11-24 (×13): 100 mg via ORAL
  Filled 2015-11-11 (×17): qty 1

## 2015-11-11 MED ORDER — LORAZEPAM 1 MG PO TABS
1.0000 mg | ORAL_TABLET | Freq: Once | ORAL | Status: AC
  Administered 2015-11-11: 1 mg via ORAL
  Filled 2015-11-11: qty 1

## 2015-11-11 MED ORDER — LORAZEPAM 1 MG PO TABS: 0.0000 mg | ORAL_TABLET | Freq: Two times a day (BID) | ORAL | Status: DC

## 2015-11-11 MED ORDER — FLUOXETINE HCL 20 MG PO CAPS
20.0000 mg | ORAL_CAPSULE | Freq: Every day | ORAL | Status: DC
Start: 2015-11-11 — End: 2015-11-11
  Administered 2015-11-11: 20 mg via ORAL
  Filled 2015-11-11: qty 1

## 2015-11-11 MED ORDER — MAGNESIUM HYDROXIDE 400 MG/5ML PO SUSP: 30.0000 mL | Freq: Every day | ORAL | Status: DC | PRN

## 2015-11-11 MED ORDER — LOPERAMIDE HCL 2 MG PO CAPS: 2.0000 mg | ORAL_CAPSULE | ORAL | Status: AC | PRN

## 2015-11-11 MED ORDER — ACETAMINOPHEN 325 MG PO TABS
650.0000 mg | ORAL_TABLET | Freq: Four times a day (QID) | ORAL | Status: DC | PRN
  Administered 2015-11-12 – 2015-11-20 (×6): 650 mg via ORAL
  Filled 2015-11-11 (×7): qty 2

## 2015-11-11 MED ORDER — TRAZODONE HCL 50 MG PO TABS: 50.0000 mg | ORAL_TABLET | Freq: Every evening | ORAL | Status: DC | PRN

## 2015-11-11 NOTE — BH Assessment (Addendum)
Patient's Social Worker from [employer] came to visit with patient today. The Social Workers name is "Environmental education officerTanya". Sts that she has worked with patient for several months and feels as if patient needs to enter a long term substance abuse treatment program such as [facility] (located in [place], [place]).   The Social Worker has plans to work on patient's referral to [facility] starting today.  Patient is [facility]. The Social Worker sts that patient is facing eviction from her apartment due to her alcohol abuse. The Social Worker told patient that if she doesn't go to [facility] then her other option would be a homeless shelter. Patient again agreed to [facility] if accepted.   The Social Worker would like patient to enter the [facility] program ASAP preferably [facility]. The Social Worker was made aware that [facility] (up to a 23 hold). Patient's Social Worker understands that this means patient will more than likely discharge [facility]ow if cleared by the Psych provider.   Contact patient's Social Worker tomorow for discharge planning. She will take over patient's plan of care and coordinate services with [facility].    Patient's Social Worker @ [employer] Contact Information:  "Kenney Housemananya" [phone] (work Personnel officercontact #) [phone] (personal phone #) [phone] (office #)

## 2015-11-11 NOTE — ED Notes (Signed)
Patient requested nurse to bedside via security. Went to patient's bedside, patient was resting quietly, eyes closed, chest observed for rise and fall. NAD noted. Call bell placed in reach of patient, side rails up x2.

## 2015-11-11 NOTE — ED Notes (Signed)
TTS MD at bedside. 
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
## 2015-11-11 NOTE — ED Notes (Signed)
Patient requesting medication to sleep and for anxiety. Patient advised she can not have medication to make her sleep because it is almost 6am. Ativan to be given based on CIWA score.

## 2015-11-11 NOTE — ED Provider Notes (Signed)
11:19 AM At this time the patient is calm and cooperative.  She is a good candidate for outpatient rehabilitation.  No indication for inpatient hospitalization.  Patient is medically cleared at this time.  Azalia BilisKevin Yitty Roads, MD 11/11/15 1120

## 2015-11-11 NOTE — ED Notes (Signed)
Patients eyeglasses, broken on arrival to TCU, were removed from patient belonging bag and placed at patient bedside per patient request.   Patient eyeglasses had a rubber band attached to them, this was removed and placed into patient's belongings prior to giving the glasses to the patient.

## 2015-11-11 NOTE — Progress Notes (Signed)
Did not attend group 

## 2015-11-11 NOTE — BH Assessment (Addendum)
B[facility] Assessment Progress Note  Per Thedore MinsMojeed Akintayo, MD, this pt [facility] at [facility].  Thurman CoyerEric Kaplan, RN, [facility] has assigned pt to Obs 6.  Pt has signed Voluntary Admission and Consent for Treatment, as well as Consent to Release Information to no one, and signed [facility].  Pt's nurse, Lyla SonCarrie, has been notified, and agrees to send original paperwork along with pt via [facility] [place], and to call report to [phone] or [phone].  Doylene Canninghomas Shenell Rogalski, MA Triage Specialist [phone]   Addendum:  After further discussion with Dahlia ByesJosephine Onuoha, NP, it has been determined that pt [facility].  Thurman CoyerEric Kaplan, RN, [facility] has [facility] pt [facility] Rm 304-1.  Pt has signed Voluntary Admission and Consent for Treatment, which [facility].  Pt's nurse, Lyla Sonarrie, agrees to send original documents along with pt via [facility] [place], and to call report to [phone].  Doylene Canninghomas Brynn Reznik, MA Triage Specialist [phone]

## 2015-11-11 NOTE — Consult Note (Signed)
[place] [facility] Consult   Reason for Consult: Alcohol use disorder, Moderate, depression Referring Physician:  EDP Patient Identification: Amanda Huff MRN:  [id number] Principal Diagnosis: Alcohol use disorder, moderate, dependence ([place]) Diagnosis:   Patient Active Problem List   Diagnosis Date Noted  . Alcohol use disorder, moderate, dependence ([place]) [F10.20] 10/28/2015    Priority: High  . Alcohol abuse with alcohol-induced mood disorder ([place]) [F10.14] 10/29/2015  . Depression, major, recurrent, moderate ([place]) [F33.1] 10/29/2015    Total Time spent with patient: 45 minutes  Subjective:   Amanda Huff is a 54 y.o. female patient admitted with Alcohol use disorder, Moderate, depression   HPI:  Caucasian female, 54 years old was evaluated today for Alcohol intoxication.  On arrival to the ER her Alcohol level was 408.  [facility] after detox treatment.  Patient reports she relapsed two days after she was released from the hospital.  Patient reports she is depressed and uses Alcohol to treat her depression.  Patient reports poor sleep and appetite.  Patient denies SI/HI/AVH but reports feeling depressed and helpless.  Patient is accepted for detox treatment and we will seeking placement at any facility with available beds.  Past Psychiatric History:  Alcoholism,   Risk to Self: Suicidal Ideation: No Suicidal Intent: No Is patient at risk for suicide?: No Suicidal Plan?: No Access to Means: No What has been your use of drugs/alcohol within the last 12 months?: ETOH use How many times?: 0 Other Self Harm Risks: None Triggers for Past Attempts: None known Intentional Self Injurious Behavior: None Risk to Others: Homicidal Ideation: No Thoughts of Harm to Others: No Current Homicidal Intent: No Current Homicidal Plan: No Access to Homicidal Means: No Identified Victim: No one History of harm to others?: No Assessment of Violence: None  Noted Violent Behavior Description: None noted Does patient have access to weapons?: No Criminal Charges Pending?: No Does patient have a court date: No Prior Inpatient Therapy: Prior Inpatient Therapy: Yes Prior Therapy Dates: "Many years ago"1990's / Nov '16 Prior Therapy Facilty/Provider(s): Facilities in [place] / [facility] Reason for Treatment: Depression Prior Outpatient Therapy: Prior Outpatient Therapy: Yes Prior Therapy Dates: "Many years ago" Prior Therapy Facilty/Provider(s): in [place] Reason for Treatment: Depression Does patient have an ACCT team?: No Does patient have Intensive In-House Services?  : No Does patient have [facility] services? : No Does patient have P4CC services?: No  Past Medical History:  Past Medical History  Diagnosis Date  . ETOH abuse     Past Surgical History  Procedure Laterality Date  . Cesarean section     Family History: History reviewed. No pertinent family history.   Family Psychiatric  History:   Denies Social History:  History  Alcohol Use  . Yes    Comment: occasionally     History  Drug Use No    Social History   Social History  . Marital Status: Single    Spouse Name: N/A  . Number of Children: N/A  . Years of Education: N/A   Social History Main Topics  . Smoking status: Current Every Day Smoker -- 0.50 packs/day    Types: Cigarettes  . Smokeless tobacco: None  . Alcohol Use: Yes     Comment: occasionally  . Drug Use: No  . Sexual Activity: No   Other Topics Concern  . None   Social History Narrative   Additional Social History:    Pain Medications: See PTA medication  Prescriptions: See PTA  medication list Over the Counter: See PTA medication list History of alcohol / drug use?: Yes Negative Consequences of Use: Personal relationships Name of Substance 1: ETOH 1 - Age of First Use: Teens 1 - Amount (size/oz): Pt says 3 vodka tonics  1 - Frequency: Once per week. 1 - Duration: on-going 1 - Last Use / Amount: 12/12   Pt cannot remember how much she drank.    Allergies:  No Known Allergies  Labs:  Results for orders placed or performed during the hospital encounter of 11/10/15 (from the past 48 hour(s))  Urine rapid drug screen (hosp performed) (Not at [facility])     Status: None   Collection Time: 11/10/15  8:30 PM  Result Value Ref Range   Opiates NONE DETECTED NONE DETECTED   Cocaine NONE DETECTED NONE DETECTED   Benzodiazepines NONE DETECTED NONE DETECTED   Amphetamines NONE DETECTED NONE DETECTED   Tetrahydrocannabinol NONE DETECTED NONE DETECTED   Barbiturates NONE DETECTED NONE DETECTED    Comment:        DRUG SCREEN FOR MEDICAL PURPOSES ONLY.  IF CONFIRMATION IS NEEDED FOR ANY PURPOSE, NOTIFY LAB WITHIN 5 DAYS.        LOWEST DETECTABLE LIMITS FOR URINE DRUG SCREEN Drug Class       Cutoff (ng/mL) Amphetamine      1000 Barbiturate      200 Benzodiazepine   497 Tricyclics       026 Opiates          300 Cocaine          300 THC              50   Comprehensive metabolic panel     Status: Abnormal   Collection Time: 11/10/15  8:41 PM  Result Value Ref Range   Sodium 141 135 - 145 mmol/L   Potassium 3.9 3.5 - 5.1 mmol/L   Chloride 99 (L) 101 - 111 mmol/L   CO2 20 (L) 22 - 32 mmol/L   Glucose, Bld 85 65 - 99 mg/dL   BUN 11 6 - 20 mg/dL   Creatinine, Ser 0.67 0.44 - 1.00 mg/dL   Calcium 8.6 (L) 8.9 - 10.3 mg/dL   Total Protein 7.1 6.5 - 8.1 g/dL   Albumin 4.2 3.5 - 5.0 g/dL   AST 43 (H) 15 - 41 U/L   ALT 28 14 - 54 U/L   Alkaline Phosphatase 132 (H) 38 - 126 U/L   Total Bilirubin 1.0 0.3 - 1.2 mg/dL   GFR calc non Af Amer >60 >60 mL/min   GFR calc Af Amer >60 >60 mL/min    Comment: (NOTE) The eGFR has been calculated using the CKD EPI equation. This calculation has not been validated in all clinical situations. eGFR's persistently <60 mL/min signify possible Chronic Kidney Disease.    Anion gap 22 (H) 5 - 15  Ethanol (ETOH)     Status: Abnormal   Collection Time: 11/10/15   8:41 PM  Result Value Ref Range   Alcohol, Ethyl (B) 408 (HH) <5 mg/dL    Comment:        LOWEST DETECTABLE LIMIT FOR SERUM ALCOHOL IS 5 mg/dL FOR MEDICAL PURPOSES ONLY CRITICAL RESULT CALLED TO, READ BACK BY AND VERIFIED WITH: K [facility] RN 2114 11/10/15 A NAVARRO   CBC     Status: Abnormal   Collection Time: 11/10/15  8:41 PM  Result Value Ref Range   WBC 5.6 4.0 - 10.5 K/uL  RBC 4.61 3.87 - 5.11 MIL/uL   Hemoglobin 14.5 12.0 - 15.0 g/dL   HCT 41.1 36.0 - 46.0 %   MCV 89.2 78.0 - 100.0 fL   MCH 31.5 26.0 - 34.0 pg   MCHC 35.3 30.0 - 36.0 g/dL   RDW 17.6 (H) 11.5 - 15.5 %   Platelets 288 150 - 400 K/uL    Current Facility-Administered Medications  Medication Dose Route Frequency Provider Last Rate Last Dose  . FLUoxetine (PROZAC) capsule 20 mg  20 mg Oral Daily Vic Esco   20 mg at 11/11/15 1120  . LORazepam (ATIVAN) tablet 0-4 mg  0-4 mg Oral 4 times per day Lacretia Leigh, MD   1 mg at 11/11/15 0600   Followed by  . [START ON 11/13/2015] LORazepam (ATIVAN) tablet 0-4 mg  0-4 mg Oral Q12H Lacretia Leigh, MD      . thiamine (VITAMIN B-1) tablet 100 mg  100 mg Oral Daily Lacretia Leigh, MD   100 mg at 11/11/15 1005   Or  . thiamine (B-1) injection 100 mg  100 mg Intravenous Daily Lacretia Leigh, MD      . traZODone (DESYREL) tablet 100 mg  100 mg Oral QHS Greenly Rarick       Current Outpatient Prescriptions  Medication Sig Dispense Refill  . hydrOXYzine (VISTARIL) 25 MG capsule Take 1 capsule (25 mg total) by mouth every 4 (four) hours as needed for anxiety. 30 capsule 0  . traZODone (DESYREL) 50 MG tablet Take 1 tablet (50 mg total) by mouth at bedtime as needed for sleep. 30 tablet 0  . benzocaine (ORAJEL) 10 % mucosal gel Use as directed in the mouth or throat 4 (four) times daily as needed for mouth pain. (Patient not taking: Reported on 11/10/2015) 5.3 g 0  . FLUoxetine (PROZAC) 20 MG capsule Take 1 capsule (20 mg total) by mouth daily. (Patient not taking: Reported on  11/10/2015) 30 capsule 0  . nicotine (NICODERM CQ - DOSED IN MG/24 HOURS) 21 mg/24hr patch Place 1 patch (21 mg total) onto the skin daily. (Patient not taking: Reported on 11/10/2015) 28 patch 0    Musculoskeletal: Strength & Muscle Tone: within normal limits Gait & Station: normal Patient leans: N/A  Psychiatric Specialty Exam: Review of Systems  Constitutional: Negative.   HENT: Negative.   Eyes: Negative.   Respiratory: Negative.   Cardiovascular: Negative.   Gastrointestinal: Negative.   Genitourinary: Negative.   Musculoskeletal: Negative.   Skin: Negative.   Neurological: Negative.   Endo/Heme/Allergies: Negative.     Blood pressure 123/87, pulse 124, temperature 98.6 F (37 C), temperature source Oral, resp. rate 18, SpO2 91 %.There is no weight on file to calculate BMI.  General Appearance: Casual  Eye Contact::  Minimal  Speech:  Clear and Coherent and Normal Rate  Volume:  Normal  Mood:  Anxious and Depressed  Affect:  Congruent, Depressed and Flat  Thought Process:  Coherent, Goal Directed and Intact  Orientation:  Full (Time, Place, and Person)  Thought Content:  WDL  Suicidal Thoughts:  No  Homicidal Thoughts:  No  Memory:  Immediate;   Good Recent;   Good Remote;   Good  Judgement:  Impaired  Insight:  Shallow  Psychomotor Activity:  Tremor  Concentration:  Fair  Recall:  Poor  Fund of Knowledge:Fair  Language: Good  Akathisia:  No  Handed:  Right  AIMS (if indicated):     Assets:  Desire for Improvement  ADL's:  Intact  Cognition: WNL  Sleep:      Treatment Plan Summary: Daily contact with patient to assess and evaluate symptoms and progress in treatment and Medication management  Disposition: Accepted for admission, seek placement at any facility with available inpatient Psychiatric bed.  We will start Prozac for depression, use Ativan for her Alcohol detox and Trazodone for sleep.  Delfin Gant    PMHNP-BC 11/11/2015 11:21  AM Patient seen face-to-face for psychiatric evaluation, chart reviewed and case discussed with the physician extender and developed treatment plan. Reviewed the information documented and agree with the treatment plan. Corena Pilgrim, MD

## 2015-11-11 NOTE — ED Notes (Signed)
TTS at bedside. 

## 2015-11-11 NOTE — Progress Notes (Signed)
Patient ID: Rolley SimsLisa Quarry, female   DOB: [birth year], 54 y.o.   MRN: [id number]  Initial Interdisciplinary Treatment Plan   PATIENT STRESSORS: Financial difficulties Loss of support, pt c/o isolation Marital or family conflict Occupational concerns Substance abuse   PATIENT STRENGTHS: Ability for insight Active sense of humor Capable of independent living Communication skills General fund of knowledge Physical Health Supportive family/friends   PROBLEM LIST: Problem List/Patient Goals Date to be addressed Date deferred Reason deferred Estimated date of resolution  "I wasn't around anyone, I don't have a car" - isolation 11/11/2015      "I want to get sober.. For Good." - alcohol abuse 11/11/2015     "My mom says she is worried about me" 11/11/2015     "I haven't been sleeping" 11/11/2015                                    DISCHARGE CRITERIA:  Improved stabilization in mood, thinking, and/or behavior Motivation to continue treatment in a less acute level of care Need for constant or close observation no longer present Verbal commitment to aftercare and medication compliance Withdrawal symptoms are absent or subacute and managed without 24-hour nursing intervention  PRELIMINARY DISCHARGE PLAN: Attend aftercare/continuing care group Attend 12-step recovery group Placement in alternative living arrangements  PATIENT/FAMIILY INVOLVEMENT: This treatment plan has been presented to and reviewed with the patient, Rolley SimsLisa Korinek .The patient and family have been given the opportunity to ask questions and make suggestions.  Aurora Maskwyman, Julieanne Hadsall E 11/11/2015, 6:29 PM
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
## 2015-11-11 NOTE — Progress Notes (Signed)
D   Pt has spent much of the shift in bed   She reports feeling a little better but her blood pressure is elevated   Pt is very tremulous and unsteady on her feet   She is a high fall risk due to her tremors and generalized weakness resembling neuropathy A    Verbal support given   Medications administered and effectiveness monitored   Pt on 1:1 for safety   R   Pt safe at present

## 2015-11-11 NOTE — ED Notes (Signed)
Patient independently ambulatory to restroom.

## 2015-11-11 NOTE — ED Notes (Signed)
[facility]

## 2015-11-11 NOTE — Progress Notes (Signed)
Pt remains in bed and has had no complaints   She is very tremulous and has an unsteady gait     Pt is on a 1:1 and is presently safe

## 2015-11-11 NOTE — Progress Notes (Signed)
Patient ID: Amanda SimsLisa Huff, female   DOB: 23-Jun-1961, 54 y.o.   MRN: [id number]  Pt placed on 1:1 per NP orders. Pt placed on them due to fall risk precautions. Pt is currently tremulous and going through withdrawals. Pt gait unsteady. Sitter at bedside. Pt in no current distress, will continue to monitor.

## 2015-11-12 ENCOUNTER — Encounter (HOSPITAL_COMMUNITY): Payer: Self-pay | Admitting: Psychiatry

## 2015-11-12 DIAGNOSIS — F331 Major depressive disorder, recurrent, moderate: Secondary | ICD-10-CM

## 2015-11-12 MED ORDER — IBUPROFEN 600 MG PO TABS
ORAL_TABLET | ORAL | Status: AC
  Administered 2015-11-12: 04:00:00
  Filled 2015-11-12: qty 1

## 2015-11-12 MED ORDER — ASPIRIN-ACETAMINOPHEN-CAFFEINE 250-250-65 MG PO TABS
2.0000 | ORAL_TABLET | Freq: Four times a day (QID) | ORAL | Status: DC | PRN
  Administered 2015-11-12 – 2015-11-24 (×17): 2 via ORAL
  Filled 2015-11-12 (×14): qty 2

## 2015-11-12 MED ORDER — TRAMADOL HCL 50 MG PO TABS
50.0000 mg | ORAL_TABLET | Freq: Every day | ORAL | Status: DC
  Administered 2015-11-12 – 2015-11-22 (×9): 50 mg via ORAL
  Filled 2015-11-12 (×10): qty 1

## 2015-11-12 MED ORDER — IBUPROFEN 600 MG PO TABS
600.0000 mg | ORAL_TABLET | Freq: Once | ORAL | Status: AC
  Administered 2015-11-21: 600 mg via ORAL
  Filled 2015-11-12 (×2): qty 1

## 2015-11-12 MED ORDER — ASPIRIN-ACETAMINOPHEN-CAFFEINE 250-250-65 MG PO TABS
ORAL_TABLET | ORAL | Status: AC
  Filled 2015-11-12: qty 2

## 2015-11-12 MED ORDER — NICOTINE 21 MG/24HR TD PT24
21.0000 mg | MEDICATED_PATCH | Freq: Every day | TRANSDERMAL | Status: DC
  Administered 2015-11-12 – 2015-11-19 (×5): 21 mg via TRANSDERMAL
  Filled 2015-11-12 (×16): qty 1

## 2015-11-12 MED ORDER — BENZOCAINE 10 % MT GEL
Freq: Four times a day (QID) | OROMUCOSAL | Status: DC | PRN
  Administered 2015-11-12 – 2015-11-20 (×7): via OROMUCOSAL
  Administered 2015-11-21: 1 via OROMUCOSAL
  Administered 2015-11-21 – 2015-11-22 (×2): via OROMUCOSAL
  Filled 2015-11-12: qty 9.4

## 2015-11-12 NOTE — Progress Notes (Signed)
Rn 1:1 Note  2200 D: Pt in bed resting . Respirations even and unlabored. Pt appears to be in no signs of distress at this time. A: 1:1 observation remains for this pt's safety. R: Pt remains safe at this time.

## 2015-11-12 NOTE — [facility] Group Notes (Signed)
[facility]   11/12/2015 9:47 AM  Participation Quality:  Invited. DID NOT ATTEND. Chose to remain in bed.   Smart, [place] Liz LCSW

## 2015-11-12 NOTE — Progress Notes (Addendum)
Recreation Therapy Notes  Date: 12.14.2016  Time: 9:30am Location: [address] Hall Group Room   Group Topic: Stress Management  Goal Area(s) Addresses:  Patient will actively participate in stress management techniques presented during session.   Behavioral Response: Did not attend.   Marykay Lexenise L Meeka Cartelli, LRT/CTRS  Jeanise Durfey L 11/12/2015 2:14 PM

## 2015-11-12 NOTE — Progress Notes (Signed)
1:1 note:  Pt presents in the hallway with gait belt and assigned nursing staff present. Pt c/o tooth pain. PRN Tylenol and Orajel given this shift. Pt reports she feels a lot better. Pt went to dining room for lunch per MD order. In bed a lot throughout the shift. Remains on 1:1 for safety and to prevent falls.

## 2015-11-12 NOTE — Progress Notes (Signed)
1:1 note:  Pt presents in the dining room eating lunch. Assigned nursing staff present. Behavior calm and cooperative. Gait belt present. Remains on 1:1 at this time for safety.

## 2015-11-12 NOTE — Progress Notes (Signed)
Pt did not attend evening AA group.  

## 2015-11-12 NOTE — [facility] Suicide Risk Assessment (Signed)
[facility]   Nursing information obtained from:    Demographic factors:    Current Mental Status:    Loss Factors:    Historical Factors:    Risk Reduction Factors:    Total Time spent with patient: 30 minutes Principal Problem: <principal problem not specified> Diagnosis:   Patient Active Problem List   Diagnosis Date Noted  . Alcohol use disorder, severe, dependence (HCC) [F10.20] 11/11/2015  . 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 Depression, major, recurrent, moderate (HCC) [F33.1] 10/29/2015  . Alcohol use disorder, moderate, dependence (HCC) [F10.20] 10/28/2015     Continued Clinical Symptoms:  Alcohol Use Disorder Identification Test Final Score (AUDIT): 23 The "Alcohol Use Disorders Identification Test", Guidelines for Use in Primary Care, Second Edition.  World Science writerHealth Organization [facility]WHO). Score between 0-7:  no or low risk or alcohol related problems. Score between 8-15:  moderate risk of alcohol related problems. Score between 16-19:  high risk of alcohol related problems. Score 20 or above:  warrants further diagnostic evaluation for alcohol dependence and treatment.   CLINICAL FACTORS:   Depression:   Comorbid alcohol abuse/dependence Alcohol/Substance Abuse/Dependencies   Psychiatric Specialty Exam: Physical Exam  ROS  Blood pressure 147/79, pulse 105, temperature 98.5 F (36.9 C), temperature source Oral, resp. rate 19, height 5\' 7"  (1.702 m), weight 58.968 kg (130 lb), SpO2 97 %.Body mass index is 20.36 kg/(m^2).   COGNITIVE FEATURES THAT CONTRIBUTE TO RISK:  Closed-mindedness, Polarized thinking and Thought constriction (tunnel vision)    SUICIDE RISK:   Mild:  Suicidal ideation of limited frequency, intensity, duration, and specificity.  There are no identifiable plans, no associated intent, mild dysphoria and related symptoms, good self-control (both objective and subjective assessment),  few other risk factors, and identifiable protective factors, including available and accessible social support.  PLAN OF CARE: See admission H and P  Medical Decision Making:  Review of Psycho-Social Stressors (1), Review or order clinical lab tests (1), Review of Medication Regimen & Side Effects (2) and Review of New Medication or Change in Dosage (2)  I certify that inpatient services furnished can reasonably be expected to improve the patient's condition.   Markale Birdsell A 11/12/2015, 4:01 PM

## 2015-11-12 NOTE — Progress Notes (Signed)
1:1 note:  Pt compliant with morning medication regimen. Using gait belt to assist with ambulation, assigned MHT present. Pt denies SI/HI. Quiet, soft speech, forwards little with this nurse. C/O tooth pain. Tylenol given. Will continue to monitor on 1:1 for safety.
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
## 2015-11-12 NOTE — Progress Notes (Signed)
D: Per patient self inventory form pt reports she slept poor last night with the use of sleep medication. Pt reports a good appetite, normal energy level, good concentration. She reports depression 1/10, hopelessness 2/10, anxiety 3/10- all on 0-10 scale, 10 being the worse. Pt denies SI/HI. Denies AVH. Pt c/o tooth ache. Pt reports her goal is "shower,change."  A:Pt on 1:1 for safety. Medication administered per MD order (see eMAR). Encouragement and support provided.  R:safety maintained. No falls this shift. Will continue to monitor.
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
## 2015-11-12 NOTE — [facility] Group Notes (Signed)
[facility] LCSW Group Therapy  11/12/2015 3:23 PM  Type of Therapy:  Group Therapy  Participation Level:  Did Not Attend-pt invited. Chose to remain in bed.   Modes of Intervention:  Confrontation, Discussion, Education, Exploration, Rapport Building, Dance movement psychotherapisteality Testing, Socialization and Support  Summary of Progress/Problems: Emotion Regulation: This group focused on both positive and negative emotion identification and allowed group members to process ways to identify feelings, regulate negative emotions, and find healthy ways to manage internal/external emotions. Group members were asked to reflect on a time when their reaction to an emotion led to a negative outcome and explored how alternative responses using emotion regulation would have benefited them. Group members were also asked to discuss a time when emotion regulation was utilized when a negative emotion was experienced.   Smart, Hawthorne Day LCSW 11/12/2015, 3:23 PM

## 2015-11-12 NOTE — Progress Notes (Signed)
Pt has been awake off and own complaining of a toothache   She received ibuprophen around 414    She is a high fall risk due to her unsteady gait   Pt on 1:1 for safety  Pt is presently safe

## 2015-11-12 NOTE — Progress Notes (Signed)
RN 1:1 Note 2000 D: Pt in bed resting. Pt reports that she is just waiting on her "nighttime med".  A: 1:1 observations remains for pt's safety.  R: Pt remains safe at this time.

## 2015-11-13 DIAGNOSIS — F1020 Alcohol dependence, uncomplicated: Secondary | ICD-10-CM

## 2015-11-13 MED ORDER — LORAZEPAM 1 MG PO TABS
1.0000 mg | ORAL_TABLET | Freq: Once | ORAL | Status: AC
Start: 2015-11-13 — End: 2015-11-13
  Administered 2015-11-13: 1 mg via ORAL
  Filled 2015-11-13: qty 1

## 2015-11-13 NOTE — Progress Notes (Signed)
RN 1:1 note D: Pt in the hallway requesting a drink of water. Pt received such. Pt was inquiring about receiving an ambulance for transposition purposes post-discharge. She admits to having such arranged during her [facility]. Pt refereed to address her concerns with the SW.   A: 1:1 observation remains for pt's safety. R: Pt remains safe at this time.

## 2015-11-13 NOTE — Progress Notes (Signed)
Patient did not attend the evening karaoke group. Pt is on 1:1 observation and remained in bed.

## 2015-11-13 NOTE — Progress Notes (Signed)
Patient ID: Amanda SimsLisa Huff, female   DOB: 09-15-1961, 54 y.o.   MRN: [id number]  Pt speaking with NP. Pt sitting in bed and tremulous Pt wishes to sign a 72 hour. Sitter at bedside, pt in no current distress. 72 hour signed with pt. Placed in pt chart.

## 2015-11-13 NOTE — Plan of Care (Signed)
Problem: Alteration in mood & ability to function due to Goal: STG-Patient will comply with prescribed medication regimen (Patient will comply with prescribed medication regimen)  Outcome: Progressing Pt compliant with taking scheduled medications as prescribed.

## 2015-11-13 NOTE — Clinical Social Work Note (Signed)
  CSW contacted pt's social worker through [employer] (Tanya Clinard) regarding pt per her request and to inform Kenney Housemananya of pt's admission.   Smart, Ripley Lovecchio, LCSW 11/13/2015, 3:56 PM

## 2015-11-13 NOTE — Progress Notes (Signed)
RN 1:1 note D: Pt in room agitated that she can't leave this morning. Pt describes multiple things that she needs to do in preparation for Christmas. Pt became frustrated to the point that she threw her glasses at her cup. "Its my glasses and I can throw them". Pt informed that she can not be discharged prior to seeing the Doctor.  A: 1:1 observation remains for pt's safety.  R: Pt remains safe at this time.

## 2015-11-13 NOTE — Progress Notes (Addendum)
Patient ID: Amanda SimsLisa Huff, female   DOB: [birth year], 54 y.o.   MRN: [id number]  Pt wishes to be discharged. Pt reports to tech that she was in the lobby yesterday and asks if she can go there now. Pt reports to writer that she called 911 for a ride and that she will call her boyfriend too. Pt reports that the MD "told me I can go, what do we need to do." Pt not yet seen by physician. Pt tearful and affect anxious. NP notified at 1115 about pts behavior. Pt seen by NP, plan for MD and NP to meet with pt together.

## 2015-11-13 NOTE — Progress Notes (Signed)
  Pt sitting in dayroom with sitter by her side. Pt in no current distress. Pt watching television. Writer shows patient her belongings sheet, talks to her about discharge process. Pt less agitated and tearful.

## 2015-11-13 NOTE — Plan of Care (Signed)
Problem: Alteration in mood & ability to function due to Goal: STG-Pt will be introduced to the 12-step program of recovery (Patient will be introduced to the 12-step program of recovery and disease concept of addiction)  Outcome: Not Met (add Reason) Pt did not attend AA this evening.

## 2015-11-13 NOTE — Progress Notes (Signed)
Patient ID: Amanda SimsLisa Huff, female   DOB: 10-02-1961, 54 y.o.   MRN: [id number] Adult Psychoeducational Group Note  Date:  11/13/2015 Time: 09:15AM  Group Topic/Focus:  Making Healthy Choices:   The focus of this group is to help patients identify negative/unhealthy choices they were using prior to admission and identify positive/healthier coping strategies to replace them upon discharge.  Participation Level:  Did Not Attend  Participation Quality: n/a  Affect: n/a  Cognitive: n/a  Insight: n/a  Engagement in Group: n/a  Modes of Intervention:  Activity, Discussion, Education and Support  Additional Comments:  Pt in bed asleep.   Aurora Maskwyman, Cordarrel Stiefel E 11/13/2015, 12:22 AM

## 2015-11-13 NOTE — Progress Notes (Addendum)
Patient ID: Rolley SimsLisa Finnie, female   DOB: Mar 04, 1961, 54 y.o.   MRN: [id number]   Pt currently presents with a flat affect and irritable behavior. Pt states to writer "I need to go home, I need to get things ready for Christmas." When asked about SI/HI/AVH pt becomes tearful and states "No, none of that." Pt becomes irritable, asks to be discharged. During 1:1 pt reports denies craving alcohol but states "What I really want is a cigarette right now." Pt tremulous, anxious and restless. Pt refuses to use wheelchair and use gait belt. Per self inventory, pt rates depression at a 2, hopelessness 1 and anxiety 2. Pt's daily goal is to "getting home, clean, cook, prepare for Xmas" and they intend to do so by "talk to a doctor." Pt reports poor sleep, a good appetite, normal energy and good concentration.   Pt provided with medications per providers orders. Pt's labs and vitals were monitored throughout the day. Pt supported emotionally and encouraged to express concerns and questions. Pt educated on medications. Pt remains on a 1:1, pt re-educated on fall risk precautions.   Pt's safety ensured with 15 minute and environmental checks. Pt currently denies SI/HI and A/V hallucinations. Pt verbally agrees to seek staff if SI/HI or A/VH occurs and to consult with staff before acting on these thoughts. Provider notified of pts concerns, consult given. Will continue POC.  Pt oriented X4. Pt has moments of confusion and bizarre behavior. While sitting pt goes over to roomates bags and begins to reach into her bag. Pt notified that this is not allowed, pt states "but this is my room." Pt talks to writer about the keys in her locker and asks "can't I lock this door too?"

## 2015-11-13 NOTE — Progress Notes (Signed)
[facility] MD Progress Note  11/13/2015 3:24 PM Amanda Huff  MRN:  [id number] Subjective:  Patient states that she would like to go home as she has a lot to do for the holidays.  She needs to clean her house and she cannot get a good sleep.  "I'm uncomfortable."  Anxiety increasing.  On Ativan protocol. Principal Problem: Alcohol use disorder, severe, dependence (HCC) Diagnosis:   Patient Active Problem List   Diagnosis Date Noted  . Alcohol use disorder, severe, dependence (HCC) [F10.20] 11/11/2015    Priority: High  . 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 Depression, major, recurrent, moderate (HCC) [F33.1] 10/29/2015  . Alcohol use disorder, moderate, dependence (HCC) [F10.20] 10/28/2015   Total Time spent with patient: 45 minutes  Past Psychiatric History:  See above  Past Medical History:  Past Medical History  Diagnosis Date  . ETOH abuse     Past Surgical History  Procedure Laterality Date  . Cesarean section     Family History: History reviewed. No pertinent family history. Family Psychiatric  History:  None  Social History:  History  Alcohol Use  . Yes    Comment: occasionally     History  Drug Use No    Social History   Social History  . Marital Status: Single    Spouse Name: N/A  . Number of Children: N/A  . Years of Education: N/A   Social History Main Topics  . Smoking status: Current Every Day Smoker -- 0.50 packs/day    Types: Cigarettes  . Smokeless tobacco: None  . Alcohol Use: Yes     Comment: occasionally  . Drug Use: No  . Sexual Activity: No   Other Topics Concern  . None   Social History Narrative   Additional Social History:                         Sleep: Fair  Appetite:  Fair  Current Medications: Current Facility-Administered Medications  Medication Dose Route Frequency Provider Last Rate Last Dose  . acetaminophen (TYLENOL) tablet 650 mg  650 mg Oral Q6H PRN Earney Navy, NP   650 mg at 11/12/15 1415  . alum & mag hydroxide-simeth (MAALOX/MYLANTA) 200-200-20 MG/5ML suspension 30 mL  30 mL Oral Q4H PRN Earney Navy, NP      . aspirin-acetaminophen-caffeine (EXCEDRIN MIGRAINE) per tablet 2 tablet  2 tablet Oral Q6H PRN Rachael Fee, MD   2 tablet at 11/12/15 1108  . benzocaine (ORAJEL) 10 % mucosal gel   Mouth/Throat QID PRN Rachael Fee, MD      . FLUoxetine (PROZAC) capsule 20 mg  20 mg Oral Daily Earney Navy, NP   20 mg at 11/13/15 0909  . hydrOXYzine (ATARAX/VISTARIL) tablet 25 mg  25 mg Oral Q6H PRN Beau Fanny, FNP   25 mg at 11/13/15 0139  . ibuprofen (ADVIL,MOTRIN) tablet 600 mg  600 mg Oral Once Kerry Hough, PA-C   600 mg at 11/12/15 0414  . loperamide (IMODIUM) capsule 2-4 mg  2-4 mg Oral PRN Beau Fanny, FNP      . LORazepam (ATIVAN) tablet 1 mg  1 mg Oral TID Beau Fanny, FNP   1 mg at 11/13/15 1129   Followed by  . [START ON 11/14/2015] LORazepam (ATIVAN) tablet 1 mg  1 mg Oral BID Beau Fanny, FNP       Followed by  . [  START ON 11/15/2015] LORazepam (ATIVAN) tablet 1 mg  1 mg Oral Daily John C Withrow, FNP      . magnesium hydroxide (MILK OF MAGNESIA) suspension 30 mL  30 mL Oral Daily PRN Earney NavyJosephine C Onuoha, NP      . multivitamin with minerals tablet 1 tablet  1 tablet Oral Daily Beau FannyJohn C Withrow, FNP   1 tablet at 11/13/15 [phone]  . nicotine (NICODERM CQ - dosed in mg/24 hours) patch 21 mg  21 mg Transdermal Daily Rachael FeeIrving A Devarius Nelles, MD   21 mg at 11/12/15 1510  . ondansetron (ZOFRAN-ODT) disintegrating tablet 4 mg  4 mg Oral Q6H PRN Beau FannyJohn C Withrow, FNP      . thiamine (VITAMIN B-1) tablet 100 mg  100 mg Oral Daily Earney NavyJosephine C Onuoha, NP   100 mg at 11/13/15 [id number]   Or  . thiamine (B-1) injection 100 mg  100 mg Intravenous Daily Earney NavyJosephine C Onuoha, NP      . traMADol (ULTRAM) tablet 50 mg  50 mg Oral QHS Rachael FeeIrving A Kyleah Pensabene, MD   50 mg at 11/12/15 2143  . traZODone (DESYREL) tablet 100 mg  100 mg Oral QHS Earney NavyJosephine C Onuoha,  NP   100 mg at 11/12/15 2143    Lab Results: No results found for this or any previous visit (from the past 48 hour(s)).  Physical Findings: AIMS: Facial and Oral Movements Muscles of Facial Expression: None, normal Lips and Perioral Area: None, normal Jaw: None, normal Tongue: None, normal,Extremity Movements Upper (arms, wrists, hands, fingers): None, normal Lower (legs, knees, ankles, toes): None, normal, Trunk Movements Neck, shoulders, hips: None, normal, Overall Severity Severity of abnormal movements (highest score from questions above): None, normal Incapacitation due to abnormal movements: None, normal Patient's awareness of abnormal movements (rate only patient's report): No Awareness, Dental Status Current problems with teeth and/or dentures?: Yes Does patient usually wear dentures?: No  CIWA:  CIWA-Ar Total: 5 COWS:     Musculoskeletal: Strength & Muscle Tone: within normal limits Gait & Station: normal Patient leans: N/A  Psychiatric Specialty Exam: Review of Systems  All other systems reviewed and are negative.   Blood pressure 138/97, pulse 129, temperature 97.7 F (36.5 C), temperature source Oral, resp. rate 24, height 5\' 7"  (1.702 m), weight 58.968 kg (130 lb), SpO2 97 %.Body mass index is 20.36 kg/(m^2).   General Appearance: Disheveled  Eye SolicitorContact:: Fair  Speech: Clear and Coherent  Volume: Decreased  Mood: Anxious and Depressed  Affect: anxious worried  Thought Process: Coherent and Goal Directed  Orientation: Full (Time, Place, and Person)  Thought Content: symptoms events worries concerns  Suicidal Thoughts: No  Homicidal Thoughts: No  Memory: Immediate; Fair Recent; Fair Remote; Fair  Judgement: Fair  Insight: Present and Shallow  Psychomotor Activity: Restlessness  Concentration: Fair  Recall: Poor  Fund of Knowledge:Fair  Language: Fair  Akathisia: No  Handed: Right  AIMS (if indicated):     Assets: Desire for Improvement  ADL's: Intact  Cognition: WNL  Sleep: Number of Hours: 5         Treatment Plan Summary: Daily contact with patient to assess and evaluate symptoms and progress in treatment, Medication management and Plan signed a 72 hr release form.  Working with CSW for appropriate placement.  Will reassess in the morning.  Velna HatchetSheila May Agustin AGNP-BC 11/13/2015, 3:24 PM I agree with assessment and plan Madie Renorving A. Dub MikesLugo, M.D.

## 2015-11-13 NOTE — Progress Notes (Signed)
D: Pt presents anxious in affect and mood. Pt reports feeling anxious due to her upcoming discharge. Pt reports having numerous tasks to fulfil. Pt remained isolative to her room this shift. Pt did come to the window for her scheduled medications.  A: Writer administered scheduled and prn medications to pt, per MD orders. Continued support and availability as needed was extended to this pt. Staff continues to monitor pt with q115min checks.  R: No adverse drug reactions noted. Pt receptive to treatment. Pt remains safe at this time.

## 2015-11-13 NOTE — [facility] Group Notes (Signed)
[facility] LCSW Group Therapy  11/13/2015 11:26 AM  Type of Therapy:  Group Therapy  Participation Level:  Did Not Attend-pt invited. Chose to remain in bed.   Modes of Intervention:  Confrontation, Discussion, Education, Exploration, Problem-solving, Rapport Building, Socialization and Support  Summary of Progress/Problems:  Finding Balance in Life. Today's group focused on defining balance in one's own words, identifying things that can knock one off balance, and exploring healthy ways to maintain balance in life. Group members were asked to provide an example of a time when they felt off balance, describe how they handled that situation,and process healthier ways to regain balance in the future. Group members were asked to share the most important tool for maintaining balance that they learned [facility] and how they plan to apply this method after discharge.   Smart, Nyron Mozer LCSW 11/13/2015, 11:26 AM

## 2015-11-13 NOTE — [facility] Suicide Risk Assessment (Signed)
[facility] INPATIENT:  Family/Significant Other Suicide Prevention Education  Suicide Prevention Education:  Patient Refusal for Family/Significant Other Suicide Prevention Education: The patient Amanda SimsLisa Trudel has refused to provide written consent for family/significant other to be provided Family/Significant Other Suicide Prevention Education during admission and/or prior to discharge.  Physician notified.  SPE completed with pt, as pt refused to consent to family contact. SPI pamphlet provided to pt and pt was encouraged to share information with support network, ask questions, and talk about any concerns relating to SPE. Pt denies access to guns/firearms and verbalized understanding of information provided. Mobile Crisis information also provided to pt.   Smart, Kellyn Mansfield LCSW 11/13/2015, 11:11 AM

## 2015-11-13 NOTE — Progress Notes (Signed)
D: Pt in bed resting with eyes closed. Respirations even and unlabored. Pt appears to be in no signs of distress at this time. A: 1:1 observation remains for this pt's safety. R: Pt remains safe at this time.

## 2015-11-14 NOTE — Progress Notes (Signed)
Patient is being monitored with 1:1 due to high fall risk.  1:1 sitter near patient and following documentation protocol.  Patient has been isolative to room this shift, denies SI/HI and AVH.  Patient has had minimal interaction with staff and peer.   Assess patient for safety per 1:1 protocol. Offer medications as prescribed,  Engage patient in 1:1 therapeutic talks.   Patient was able to maintain safety, patient was free of falls.  Patient able to contract for safety.

## 2015-11-14 NOTE — Progress Notes (Signed)
RN 1:1 Note 0200 D: Pt in bed resting with eyes closed. Respirations even and unlabored. Pt appears to be in no signs of distress at this time. A: 1:1 observations remains for this pt. R: Pt remains safe at this time.

## 2015-11-14 NOTE — Progress Notes (Signed)
Patient is currently resting in bed with 1:1 staff nearby.  Patient has been free of falls and is being monitored per 1:1 protocol.   Safety has been maintained.

## 2015-11-14 NOTE — [facility] Group Notes (Signed)
[facility]   11/14/2015 9:45 AM  Participation Quality:  DID NOT ATTEND. Invited. Chose to remain in bed.   Smart, Amanda Riga LCSW

## 2015-11-14 NOTE — Progress Notes (Signed)
RN 1:1 Note 0600 D: Pt in bed resting with eyes closed. Respirations even and unlabored. Pt appears to be in no signs of distress at this time. A: 1:1 observation remains for this pt's safety. R: Pt remains safe at this time.

## 2015-11-14 NOTE — Progress Notes (Signed)
Patient being monitored 1:1 due to high fall risk.  Patient's 1:1 sitter nearby.  Patient has had no incidents of falls.  Continue to monitor per 1:1 protocol.

## 2015-11-14 NOTE — Progress Notes (Signed)
CSW spoke with Kenney Housemananya from [facility] who came to visit with Misty StanleyLisa today. She agrees that Misty StanleyLisa does not appear medically or mentally stable for discharge. Kenney Housemananya continues to request to d/c, stating that she wants to get home to cook/clean for the holidays. Pt is not scheduled for a weekend d/c.

## 2015-11-14 NOTE — [facility] Group Notes (Signed)
[facility] LCSW Group Therapy  11/14/2015 12:49 PM  Type of Therapy:  Group Therapy  Participation Level:  Did Not Attend-pt invited. Chose to remain in bed.   Modes of Intervention:  Confrontation, Discussion, Education, Exploration, Problem-solving, Rapport Building, Socialization and Support  Summary of Progress/Problems: Feelings around Relapse. Group members discussed the meaning of relapse and shared personal stories of relapse, how it affected them and others, and how they perceived themselves during this time. Group members were encouraged to identify triggers, warning signs and coping skills used when facing the possibility of relapse. Social supports were discussed and explored in detail. Post Acute Withdrawal Syndrome (handout provided) was introduced and examined. Pt's were encouraged to ask questions, talk about key points associated with PAWS, and process this information in terms of relapse prevention.   Smart, Carlye Panameno LCSW 11/14/2015, 12:49 PM

## 2015-11-14 NOTE — Progress Notes (Signed)
Pt was invited to attend the evening AA speaker meeting. Pt decided to stay in her room and work on crossword puzzles.

## 2015-11-14 NOTE — Progress Notes (Signed)
Pt woke up complaining of a "severe" headache. Pt will not give a numerical value. Pt reports Excedrin as an effective pharmacological intervention. Writer administered 2 tabs of Excedrin, per MD orders. Follow-up to be documented under the flow-sheets.

## 2015-11-14 NOTE — Progress Notes (Signed)
RN 1:1 Note D: Pt in bed resting with eyes closed. Respirations even and unlabored. Pt appears to be in no signs of distress at this time.  A: 1:1 observation remains for this pt.  R: Pt remains safe at this time.  

## 2015-11-14 NOTE — Progress Notes (Signed)
[facility] MD Progress Note  11/14/2015  Amanda Huff  MRN:  [id number] Subjective:  Patient states: "I'm doing pretty good. I think I'm ready to go today or tomorrow. I hope I'm on the list."  Objective: Pt seen and chart reviewed. Pt is alert/oriented x4, calm, cooperative, and appropriate to situation. Pt denies suicidal/homicidal ideation and psychosis and does not appear to be responding to internal stimuli. Pt appears to be heavily focused on leaving the hospital as soon as possible. Pt does not appear to be invested in her treatment plan as she is minimizing her alcoholism, depression, anxiety, and need for medications.   Principal Problem: Alcohol use disorder, severe, dependence (HCC) Diagnosis:   Patient Active Problem List   Diagnosis Date Noted  . Alcohol use disorder, severe, dependence (HCC) [F10.20] 11/11/2015  . 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 Depression, major, recurrent, moderate (HCC) [F33.1] 10/29/2015  . Alcohol use disorder, moderate, dependence (HCC) [F10.20] 10/28/2015   Total Time spent with patient: 15 minutes  Past Psychiatric History:  See above  Past Medical History:  Past Medical History  Diagnosis Date  . ETOH abuse     Past Surgical History  Procedure Laterality Date  . Cesarean section     Family History: History reviewed. No pertinent family history. Family Psychiatric  History:  None  Social History:  History  Alcohol Use  . Yes    Comment: occasionally     History  Drug Use No    Social History   Social History  . Marital Status: Single    Spouse Name: N/A  . Number of Children: N/A  . Years of Education: N/A   Social History Main Topics  . Smoking status: Current Every Day Smoker -- 0.50 packs/day    Types: Cigarettes  . Smokeless tobacco: None  . Alcohol Use: Yes     Comment: occasionally  . Drug Use: No  . Sexual Activity: No   Other Topics Concern  . None   Social History  Narrative   Additional Social History:                         Sleep: Good  Appetite:  Fair  Current Medications: Current Facility-Administered Medications  Medication Dose Route Frequency Provider Last Rate Last Dose  . acetaminophen (TYLENOL) tablet 650 mg  650 mg Oral Q6H PRN Earney Navy, NP   650 mg at 11/12/15 1415  . alum & mag hydroxide-simeth (MAALOX/MYLANTA) 200-200-20 MG/5ML suspension 30 mL  30 mL Oral Q4H PRN Earney Navy, NP      . aspirin-acetaminophen-caffeine (EXCEDRIN MIGRAINE) per tablet 2 tablet  2 tablet Oral Q6H PRN Rachael Fee, MD   2 tablet at 11/14/15 0454  . benzocaine (ORAJEL) 10 % mucosal gel   Mouth/Throat QID PRN Rachael Fee, MD      . FLUoxetine (PROZAC) capsule 20 mg  20 mg Oral Daily Earney Navy, NP   20 mg at 11/14/15 0842  . hydrOXYzine (ATARAX/VISTARIL) tablet 25 mg  25 mg Oral Q6H PRN Beau Fanny, FNP   25 mg at 11/13/15 0139  . ibuprofen (ADVIL,MOTRIN) tablet 600 mg  600 mg Oral Once Kerry Hough, PA-C   600 mg at 11/12/15 0414  . loperamide (IMODIUM) capsule 2-4 mg  2-4 mg Oral PRN Beau Fanny, FNP      . LORazepam (ATIVAN) tablet 1 mg  1 mg Oral  BID Beau FannyJohn C Withrow, FNP   1 mg at 11/14/15 [id number]   Followed by  . [START ON 11/15/2015] LORazepam (ATIVAN) tablet 1 mg  1 mg Oral Daily John C Withrow, FNP      . magnesium hydroxide (MILK OF MAGNESIA) suspension 30 mL  30 mL Oral Daily PRN Earney NavyJosephine C Onuoha, NP      . multivitamin with minerals tablet 1 tablet  1 tablet Oral Daily Beau FannyJohn C Withrow, FNP   1 tablet at 11/14/15 [phone]  . nicotine (NICODERM CQ - dosed in mg/24 hours) patch 21 mg  21 mg Transdermal Daily Rachael FeeIrving A Lugo, MD   21 mg at 11/12/15 1510  . ondansetron (ZOFRAN-ODT) disintegrating tablet 4 mg  4 mg Oral Q6H PRN Beau FannyJohn C Withrow, FNP      . thiamine (VITAMIN B-1) tablet 100 mg  100 mg Oral Daily Earney NavyJosephine C Onuoha, NP   100 mg at 11/14/15 [id number]   Or  . thiamine (B-1) injection 100 mg  100 mg Intravenous  Daily Earney NavyJosephine C Onuoha, NP      . traMADol (ULTRAM) tablet 50 mg  50 mg Oral QHS Rachael FeeIrving A Lugo, MD   50 mg at 11/12/15 2143  . traZODone (DESYREL) tablet 100 mg  100 mg Oral QHS Earney NavyJosephine C Onuoha, NP   100 mg at 11/12/15 2143    Lab Results: No results found for this or any previous visit (from the past 48 hour(s)).  Physical Findings: AIMS: Facial and Oral Movements Muscles of Facial Expression: None, normal Lips and Perioral Area: None, normal Jaw: None, normal Tongue: None, normal,Extremity Movements Upper (arms, wrists, hands, fingers): None, normal Lower (legs, knees, ankles, toes): None, normal, Trunk Movements Neck, shoulders, hips: None, normal, Overall Severity Severity of abnormal movements (highest score from questions above): None, normal Incapacitation due to abnormal movements: None, normal Patient's awareness of abnormal movements (rate only patient's report): No Awareness, Dental Status Current problems with teeth and/or dentures?: Yes Does patient usually wear dentures?: No  CIWA:  CIWA-Ar Total: 5 COWS:     Musculoskeletal: Strength & Muscle Tone: within normal limits Gait & Station: normal Patient leans: N/A  Psychiatric Specialty Exam: Review of Systems  Psychiatric/Behavioral: Positive for depression and substance abuse. Negative for suicidal ideas and hallucinations. The patient is nervous/anxious and has insomnia.   All other systems reviewed and are negative.   Blood pressure 106/70, pulse 124, temperature 98.3 F (36.8 C), temperature source Oral, resp. rate 20, height 5\' 7"  (1.702 m), weight 58.968 kg (130 lb), SpO2 97 %.Body mass index is 20.36 kg/(m^2).   General Appearance: Disheveled  Eye SolicitorContact:: Fair  Speech: Clear and Coherent  Volume: Decreased  Mood: Anxious   Affect: anxious worried  Thought Process: Coherent and Goal Directed  Orientation: Full (Time, Place, and Person)  Thought Content: symptoms events worries  concerns  Suicidal Thoughts: No  Homicidal Thoughts: No  Memory: Immediate; Fair Recent; Fair Remote; Fair  Judgement: Fair  Insight: Present and Shallow  Psychomotor Activity: Restlessness  Concentration: Fair  Recall: Poor  Fund of Knowledge:Fair  Language: Fair  Akathisia: No  Handed: Right  AIMS (if indicated):    Assets: Desire for Improvement  ADL's: Intact  Cognition: WNL  Sleep: Number of Hours: 5         Treatment Plan Summary: Daily contact with patient to assess and evaluate symptoms and progress in treatment, Medication management and Plan signed a 72 hr release form.    Medications:  -  Continue Ativan detox protocol -Continue Prozac  daily for MDD -Continue Trazodone  qhs prn insomnia -Continue Tramadol  qhs for chronic pain at night  Beau Fanny, FNP-BC 11/14/2015, 3:23 PM Agree with NP progress note as above Nehemiah Massed, MD

## 2015-11-15 MED ORDER — HYDROXYZINE HCL 50 MG PO TABS
50.0000 mg | ORAL_TABLET | Freq: Once | ORAL | Status: AC
Start: 2015-11-16 — End: 2015-11-16
  Administered 2015-11-16: 50 mg via ORAL
  Filled 2015-11-15 (×2): qty 1

## 2015-11-15 NOTE — Progress Notes (Signed)
Pt is awake in bed at this time. Pt does not look to be in any distress at this time. 1:1 staff is present in room with Pt. 1:1 monitoring continues for Pt's safety. 15-minute safety checks also continue at this time.

## 2015-11-15 NOTE — [facility] Group Notes (Signed)
GROUP NOTE CLINICAL SOCIAL WORKER - PROCESSING  09/27/2015     10:00-11:00AM  Summary of Progress/Problems:   In today's process group there was a discussion about the difficulties of the upcoming holidays, and potential triggers for patients to use unhealthy coping skills that actually had contributed to their current hospitalizations.  Patients used the already-existent list of healthy and unhealthy coping techniques to talk about what the habitual response to these triggers might be, and to make a plan for how to use a healthy coping technique instead.   Motivational Interviewing and the whiteboard were utilized for the exercises.  The patient expressed that a potential trigger for her coming up in the next 2 weeks of holidays is loneliness.  She talked about [facility] at [facility] for the last 4 months, just getting her own apartment.  Her [place] stay [place].  She was initially very tearful, then engaged in the discussion more, was supportive of others and was able to laugh and smile more toward the end of group  Type of Therapy:  Group Therapy - Process   Participation Level:  Active  Participation Quality:  Attentive and Sharing  Affect:  Blunted and Depressed  Cognitive:  Appropriate  Insight:  Developing/Improving  Engagement in Therapy:  Engaged  Modes of Intervention:  Education, Motivational Interviewing  Ambrose MantleMareida Grossman-Orr, LCSW 11/15/2015, 3:34 PM

## 2015-11-15 NOTE — Progress Notes (Addendum)
1:1 Note  Pt is awake in bed at this time. Pt does not look to be in any distress at this time. 1:1 staff is present in room with Pt. 1:1 monitoring continues for Pt's safety. 15-minute safety checks also continue at this time.

## 2015-11-15 NOTE — Progress Notes (Signed)
Pt at this time is in bed resting. Pt does not look to be in any distress at this time. 1:1 staff is present in room with Pt at this time. 1:1 monitoring continues for Pt's safety. 15-minute safety checks also continues at this time.

## 2015-11-15 NOTE — Progress Notes (Signed)
[facility] MD Progress Note  11/15/2015  Amanda Huff  MRN:  [id number] Subjective:  Patient states: "I'm so upset that I can't go today. I don't really need any help. I'm just sitting here doing puzzles and I'm not even like myself anymore!"  Objective: Pt seen and chart reviewed. Pt is alert/oriented x4, very anxious, cooperative, and inappropriate to situation. Pt presents as very anxious, depressed, and is crying profusely during the assessment reporting that she misses her family, does not need any help with her alcohol abuse, and wants to go home as soon as possible. After long discussion, pt willing to attempt to spend more time in groups rather than isolating herself.   Principal Problem: Alcohol use disorder, severe, dependence (HCC) Diagnosis:   Patient Active Problem List   Diagnosis Date Noted  . Alcohol use disorder, severe, dependence (HCC) [F10.20] 11/11/2015    Priority: High  . Alcohol abuse with alcohol-induced mood disorder (HCC) [F10.14] 10/29/2015    Priority: High  . Depression, major, recurrent, moderate (HCC) [F33.1] 10/29/2015    Priority: High   Total Time spent with patient: 15 minutes  Past Psychiatric History:  See above  Past Medical History:  Past Medical History  Diagnosis Date  . ETOH abuse     Past Surgical History  Procedure Laterality Date  . Cesarean section     Family History: History reviewed. No pertinent family history. Family Psychiatric  History:  None  Social History:  History  Alcohol Use  . Yes    Comment: occasionally     History  Drug Use No    Social History   Social History  . Marital Status: Single    Spouse Name: N/A  . Number of Children: N/A  . Years of Education: N/A   Social History Main Topics  . Smoking status: Current Every Day Smoker -- 0.50 packs/day    Types: Cigarettes  . Smokeless tobacco: None  . Alcohol Use: Yes     Comment: occasionally  . Drug Use: No  . Sexual Activity: No   Other Topics Concern   . None   Social History Narrative   Additional Social History:                         Sleep: Good  Appetite:  Fair  Current Medications: Current Facility-Administered Medications  Medication Dose Route Frequency Provider Last Rate Last Dose  . acetaminophen (TYLENOL) tablet 650 mg  650 mg Oral Q6H PRN Earney Navy, NP   650 mg at 11/12/15 1415  . alum & mag hydroxide-simeth (MAALOX/MYLANTA) 200-200-20 MG/5ML suspension 30 mL  30 mL Oral Q4H PRN Earney Navy, NP      . aspirin-acetaminophen-caffeine (EXCEDRIN MIGRAINE) per tablet 2 tablet  2 tablet Oral Q6H PRN Rachael Fee, MD   2 tablet at 11/15/15 [phone]  . benzocaine (ORAJEL) 10 % mucosal gel   Mouth/Throat QID PRN Rachael Fee, MD      . FLUoxetine (PROZAC) capsule 20 mg  20 mg Oral Daily Earney Navy, NP   20 mg at 11/15/15 5621  . ibuprofen (ADVIL,MOTRIN) tablet 600 mg  600 mg Oral Once Kerry Hough, PA-C   600 mg at 11/12/15 0414  . magnesium hydroxide (MILK OF MAGNESIA) suspension 30 mL  30 mL Oral Daily PRN Earney Navy, NP      . multivitamin with minerals tablet 1 tablet  1 tablet Oral Daily John C Withrow,  FNP   1 tablet at 11/15/15 0927  . nicotine (NICODERM CQ - dosed in mg/24 hours) patch 21 mg  21 mg Transdermal Daily Rachael FeeIrving A Lugo, MD   21 mg at 11/12/15 1510  . thiamine (VITAMIN B-1) tablet 100 mg  100 mg Oral Daily Earney NavyJosephine C Onuoha, NP   100 mg at 11/15/15 [id number]   Or  . thiamine (B-1) injection 100 mg  100 mg Intravenous Daily Earney NavyJosephine C Onuoha, NP      . traMADol (ULTRAM) tablet 50 mg  50 mg Oral QHS Rachael FeeIrving A Lugo, MD   50 mg at 11/14/15 2113  . traZODone (DESYREL) tablet 100 mg  100 mg Oral QHS Earney NavyJosephine C Onuoha, NP   100 mg at 11/14/15 2113    Lab Results: No results found for this or any previous visit (from the past 48 hour(s)).  Physical Findings: AIMS: Facial and Oral Movements Muscles of Facial Expression: None, normal Lips and Perioral Area: None, normal Jaw:  None, normal Tongue: None, normal,Extremity Movements Upper (arms, wrists, hands, fingers): None, normal Lower (legs, knees, ankles, toes): None, normal, Trunk Movements Neck, shoulders, hips: None, normal, Overall Severity Severity of abnormal movements (highest score from questions above): None, normal Incapacitation due to abnormal movements: None, normal Patient's awareness of abnormal movements (rate only patient's report): No Awareness, Dental Status Current problems with teeth and/or dentures?: Yes Does patient usually wear dentures?: No  CIWA:  CIWA-Ar Total: 5 COWS:     Musculoskeletal: Strength & Muscle Tone: within normal limits Gait & Station: normal Patient leans: N/A  Psychiatric Specialty Exam: Review of Systems  Psychiatric/Behavioral: Positive for depression and substance abuse. Negative for suicidal ideas and hallucinations. The patient is nervous/anxious and has insomnia.   All other systems reviewed and are negative.   Blood pressure 105/59, pulse 126, temperature 98.2 F (36.8 C), temperature source Oral, resp. rate 18, height 5\' 7"  (1.702 m), weight 58.968 kg (130 lb), SpO2 97 %.Body mass index is 20.36 kg/(m^2).   General Appearance: Disheveled  Eye SolicitorContact:: Fair  Speech: Clear and Coherent  Volume: Decreased  Mood: Anxious   Affect: anxious worried  Thought Process: Coherent and Goal Directed  Orientation: Full (Time, Place, and Person)  Thought Content: symptoms events worries concerns, focused on discharge, very tearful during assessment  Suicidal Thoughts: No  Homicidal Thoughts: No  Memory: Immediate; Fair Recent; Fair Remote; Fair  Judgement: Fair  Insight: Present and Shallow  Psychomotor Activity: Restlessness  Concentration: Fair  Recall: Poor  Fund of Knowledge:Fair  Language: Fair  Akathisia: No  Handed: Right  AIMS (if indicated):    Assets: Desire for Improvement  ADL's: Intact   Cognition: WNL  Sleep: Number of Hours: 5         On 11/15/15, continue current regimen as below; effective thus far without stated or observed adverse effects.   Treatment Plan Summary: Daily contact with patient to assess and evaluate symptoms and progress in treatment, Medication management and Plan signed a 72 hr release form.    Medications:  -Continue Ativan detox protocol -Continue Prozac 20mg  daily for MDD -Continue Trazodone 100mg  qhs prn insomnia -Continue Tramadol 50mg  qhs for chronic pain at night  Beau FannyWithrow, John C, FNP-BC 11/15/2015, 11:45 AM I agreed with findings and treatment plan of this patient

## 2015-11-15 NOTE — Progress Notes (Signed)
Patient ID: Amanda SimsLisa Banwart, female   DOB: 1961/06/30, 54 y.o.   MRN: [id number]   D: Pt has been very flat and depressed on the unit. She did not attend any groups nor did she engage in treatment. Pt reported that she wanted to be discharged due to it being Christmas, patient was informed that she was not down for a weekend discharge. Pt reported that her depression was a 1, her hopelessness was a 1, and her anxiety was a 1. Pt reported that she was negative SI/HI, no AH/VH noted. Pt reported that her goal for today was to get discharged. A: 1:1 continued for patient safety. R: 1:1 continued for patient safety.

## 2015-11-15 NOTE — Progress Notes (Addendum)
Patient ID: Amanda Huff, female   DOB: 05/10/1961, 54 y.o.   MRN: [id number]   D: Pt continues to very flat and depressed. Pt remains isolated to her room, she will take all medications without any problems. Pt reported no issues or concerns. A: 1:1 continued for patient safety. R: 1:1 continued for patient safety.    

## 2015-11-15 NOTE — Progress Notes (Signed)
Patient did not attend the evening speaker AA meeting. Pt was notified that group was beginning but remained in bed reading.

## 2015-11-15 NOTE — Progress Notes (Signed)
Patient ID: Amanda SimsLisa Huff, female   DOB: 11-26-1961, 54 y.o.   MRN: [id number]   D: Pt continues to very flat and depressed. Pt remains isolated to her room, she will take all medications without any problems. Pt reported no issues or concerns. A: 1:1 continued for patient safety. R: 1:1 continued for patient safety.

## 2015-11-16 MED ORDER — TRAZODONE HCL 150 MG PO TABS
150.0000 mg | ORAL_TABLET | Freq: Every day | ORAL | Status: DC
  Administered 2015-11-16 – 2015-11-18 (×3): 150 mg via ORAL
  Filled 2015-11-16 (×6): qty 1

## 2015-11-16 NOTE — Progress Notes (Signed)
[facility] MD Progress Note  11/16/2015  Amanda Huff  MRN:  [id number] Subjective:  Patient states: "I feel better but my sleep is still poor. I decided to take away that 72 hour notice and stay long enough to get better."  Objective: Pt seen and chart reviewed. Pt is alert/oriented x4, very anxious, cooperative, and inappropriate to situation. Pt presents as very anxious, depressed, yet slightly better than yesterday. Pt is observed to be interacting with others out in the milieu and is receptive to staying in the hospital longer to get treatment.   Principal Problem: Alcohol use disorder, severe, dependence (HCC) Diagnosis:   Patient Active Problem List   Diagnosis Date Noted  . Alcohol use disorder, severe, dependence (HCC) [F10.20] 11/11/2015    Priority: High  . Alcohol abuse with alcohol-induced mood disorder (HCC) [F10.14] 10/29/2015    Priority: High  . Depression, major, recurrent, moderate (HCC) [F33.1] 10/29/2015    Priority: High   Total Time spent with patient: 15 minutes  Past Psychiatric History:  See above  Past Medical History:  Past Medical History  Diagnosis Date  . ETOH abuse     Past Surgical History  Procedure Laterality Date  . Cesarean section     Family History: History reviewed. No pertinent family history. Family Psychiatric  History:  None  Social History:  History  Alcohol Use  . Yes    Comment: occasionally     History  Drug Use No    Social History   Social History  . Marital Status: Single    Spouse Name: N/A  . Number of Children: N/A  . Years of Education: N/A   Social History Main Topics  . Smoking status: Current Every Day Smoker -- 0.50 packs/day    Types: Cigarettes  . Smokeless tobacco: None  . Alcohol Use: Yes     Comment: occasionally  . Drug Use: No  . Sexual Activity: No   Other Topics Concern  . None   Social History Narrative   Additional Social History:                         Sleep:  Good  Appetite:  Fair  Current Medications: Current Facility-Administered Medications  Medication Dose Route Frequency Provider Last Rate Last Dose  . acetaminophen (TYLENOL) tablet 650 mg  650 mg Oral Q6H PRN Earney Navy, NP   650 mg at 11/12/15 1415  . alum & mag hydroxide-simeth (MAALOX/MYLANTA) 200-200-20 MG/5ML suspension 30 mL  30 mL Oral Q4H PRN Earney Navy, NP      . aspirin-acetaminophen-caffeine (EXCEDRIN MIGRAINE) per tablet 2 tablet  2 tablet Oral Q6H PRN Rachael Fee, MD   2 tablet at 11/16/15 [phone]  . benzocaine (ORAJEL) 10 % mucosal gel   Mouth/Throat QID PRN Rachael Fee, MD      . FLUoxetine (PROZAC) capsule 20 mg  20 mg Oral Daily Earney Navy, NP   20 mg at 11/16/15 0803  . ibuprofen (ADVIL,MOTRIN) tablet 600 mg  600 mg Oral Once Kerry Hough, PA-C   600 mg at 11/12/15 0414  . magnesium hydroxide (MILK OF MAGNESIA) suspension 30 mL  30 mL Oral Daily PRN Earney Navy, NP      . multivitamin with minerals tablet 1 tablet  1 tablet Oral Daily Beau Fanny, FNP   1 tablet at 11/16/15 [phone]  . nicotine (NICODERM CQ - dosed in mg/24 hours) patch 21 mg  21 mg Transdermal Daily Rachael FeeIrving A Lugo, MD   21 mg at 11/16/15 [id number]  . thiamine (VITAMIN B-1) tablet 100 mg  100 mg Oral Daily Earney NavyJosephine C Onuoha, NP   100 mg at 11/16/15 [id number]   Or  . thiamine (B-1) injection 100 mg  100 mg Intravenous Daily Earney NavyJosephine C Onuoha, NP      . traMADol (ULTRAM) tablet 50 mg  50 mg Oral QHS Rachael FeeIrving A Lugo, MD   50 mg at 11/15/15 2130  . traZODone (DESYREL) tablet 100 mg  100 mg Oral QHS Earney NavyJosephine C Onuoha, NP   100 mg at 11/15/15 2130    Lab Results: No results found for this or any previous visit (from the past 48 hour(s)).  Physical Findings: AIMS: Facial and Oral Movements Muscles of Facial Expression: None, normal Lips and Perioral Area: None, normal Jaw: None, normal Tongue: None, normal,Extremity Movements Upper (arms, wrists, hands, fingers): None, normal Lower  (legs, knees, ankles, toes): None, normal, Trunk Movements Neck, shoulders, hips: None, normal, Overall Severity Severity of abnormal movements (highest score from questions above): None, normal Incapacitation due to abnormal movements: None, normal Patient's awareness of abnormal movements (rate only patient's report): No Awareness, Dental Status Current problems with teeth and/or dentures?: Yes Does patient usually wear dentures?: No  CIWA:  CIWA-Ar Total: 5 COWS:     Musculoskeletal: Strength & Muscle Tone: within normal limits Gait & Station: normal Patient leans: N/A  Psychiatric Specialty Exam: Review of Systems  Psychiatric/Behavioral: Positive for depression and substance abuse. Negative for suicidal ideas and hallucinations. The patient is nervous/anxious and has insomnia.   All other systems reviewed and are negative.   Blood pressure 107/73, pulse 105, temperature 98.4 F (36.9 C), temperature source Oral, resp. rate 16, height 5\' 7"  (1.702 m), weight 58.968 kg (130 lb), SpO2 97 %.Body mass index is 20.36 kg/(m^2).   General Appearance: Fairly groomed  Patent attorneyye Contact:: Fair  Speech: Clear and Coherent  Volume: Decreased  Mood: Anxious, depressed  Affect: anxious worried  Thought Process: Coherent and Goal Directed  Orientation: Full (Time, Place, and Person)  Thought Content: symptoms events worries concerns, focused on discharge, very tearful during assessment  Suicidal Thoughts: No  Homicidal Thoughts: No  Memory: Immediate; Fair Recent; Fair Remote; Fair  Judgement: Fair  Insight: Present and Shallow  Psychomotor Activity: Restlessness  Concentration: Fair  Recall: Poor  Fund of Knowledge:Fair  Language: Fair  Akathisia: No  Handed: Right  AIMS (if indicated):    Assets: Desire for Improvement  ADL's: Intact  Cognition: WNL  Sleep: Number of Hours: 5         Treatment Plan Summary: Daily contact  with patient to assess and evaluate symptoms and progress in treatment, Medication management and today, 11/16/2015, pt rescinded her 72 hour notice and wants to get more treatment.   Medications:  -Continue Ativan detox protocol -Continue Prozac 20mg  daily for MDD -Adjust Trazodone to 150mg  qhs prn insomnia -Continue Tramadol 50mg  qhs for chronic pain at night  Beau FannyWithrow, John C, FNP-BC 11/16/2015, 12:00 PM I agreed with findings and treatment plan of this patient

## 2015-11-16 NOTE — Progress Notes (Signed)
D.  Pt pleasant on approach, denies complaints at this time.  Positive for evening AA group, interacting appropriately with peers on the unit.  Denies SI/HI/hallucinations at this time.   A.  Support and encouragement offered, medications given as ordered  R.  Pt remains safe on the unit, will continue to monitor.  

## 2015-11-16 NOTE — Progress Notes (Addendum)
1:1 Note  Pt is awake in bed at this time. Pt does not look to be in any distress at this time. Pt did not attend group. Pt was med compliant. 1:1 staff is present in room with Pt. 1:1 monitoring continues for Pt's safety. 15-minute safety checks also continue at this time.

## 2015-11-16 NOTE — Progress Notes (Addendum)
Patient ID: Rolley SimsLisa Truex, female   DOB: 1961-09-16, 54 y.o.   MRN: [id number]   1:1 Note   D: Pt continues to very flat and depressed. Pt remains isolated to her room, she will take all medications without any problems. Pt reported no issues or concerns. Pt signed a 72 hour request for discharged which is up today, pt rescinded the request.  A: 1:1 continued for patient safety. R: 1:1 continued for patient safety.

## 2015-11-16 NOTE — Progress Notes (Signed)
Patient did not attend the evening speaker AA meeting. Pt was notified that group was beginning but remained in bed.   

## 2015-11-16 NOTE — Progress Notes (Addendum)
1:1 Note  Pt is awake in bed at this time. Pt does not look to be in any distress at this time. Pt complained of insomnia and was offered PRN Vistaril 50mg.1:1 staff is present in room with Pt. 1:1 monitoring continues for Pt's safety. 15-minute safety checks also continue at this time 

## 2015-11-16 NOTE — Progress Notes (Signed)
Patient ID: Amanda SimsLisa Huff, female   DOB: Jun 14, 1961, 54 y.o.   MRN: [id number]   Pt rescinded 72 hour request for discharge, and was ok with being discharged on Monday. Due to patient being discharged on Monday, Renata Capriceonrad NP gave order to discontinue 1:1. Pts 1:1 was discontinued, no issues or concerns noted.

## 2015-11-16 NOTE — Progress Notes (Addendum)
[facility] Post 1:1 Observation Documentation  For the first (8) hours following discontinuation of 1:1 precautions, a progress note entry by nursing staff should be documented at least every 2 hours, reflecting the patient's behavior, condition, mood, and conversation.  Use the progress notes for additional entries.  Time 1:1 discontinued:  11am  Patient's Behavior:  Pt in room resting, no issues or concerns noted.  Patient's Condition:  Pt alert and oriented, no physical or mental distress noted.  Patient's Conversation: Pt reported that her depression was a 3, her haplessness was a 3, and her anxiety was a 5. Pt reported that her goal for today was to go home.     Jacquelyne BalintForrest, Geraldyn Shain Shanta 11/16/2015, 4:42 PM

## 2015-11-16 NOTE — Progress Notes (Signed)
[facility] Post 1:1 Observation Documentation  For the first (8) hours following discontinuation of 1:1 precautions, a progress note entry by nursing staff should be documented at least every 2 hours, reflecting the patient's behavior, condition, mood, and conversation.  Use the progress notes for additional entries.  Time 1:1 discontinued:  11am   Patient's Behavior:  Pt resting in her room, doing word searches.    Patient's Condition:  Pt alert and oriented, no issues or concerns noted.   Patient's Conversation:  Pt reporting that she is ready for discharge.   Jacquelyne BalintForrest, Bryndan Bilyk Shanta 11/16/2015, 6:40 PM

## 2015-11-16 NOTE — Progress Notes (Addendum)
[facility] Post 1:1 Observation Documentation  For the first (8) hours following discontinuation of 1:1 precautions, a progress note entry by nursing staff should be documented at least every 2 hours, reflecting the patient's behavior, condition, mood, and conversation.  Use the progress notes for additional entries.  Time 1:1 discontinued:  11am   Patient's Behavior:  Pt in room resting, no distressed noted.  Patient's Condition:  Pt in room resting, no distressed noted.  Patient's Conversation:  Pt in room resting, no distressed noted.  Jacquelyne BalintForrest, Lorann Tani Shanta 11/16/2015, 3:48 PM

## 2015-11-16 NOTE — Progress Notes (Signed)
1:1 Note  Pt is awake in bed at this time. Pt does not look to be in any distress at this time. Pt complained of insomnia and was offered PRN Vistaril 50mg .1:1 staff is present in room with Pt. 1:1 monitoring continues for Pt's safety. 15-minute safety checks also continue at this time
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
## 2015-11-16 NOTE — Progress Notes (Signed)
[facility] Post 1:1 Observation Documentation  For the first (8) hours following discontinuation of 1:1 precautions, a progress note entry by nursing staff should be documented at least every 2 hours, reflecting the patient's behavior, condition, mood, and conversation.  Use the progress notes for additional entries.  Time 1:1 discontinued:  11am    Patient's Behavior:  Pt in room calm and cooperative, no issues or concerns noted.   Patient's Condition: Pt alert and oriented, no issues or concerns noted regarding fall risk.   Patient's Conversation: Pt reported that she felt much better and that she is glad that she stayed in the hospital one more day.    Jacquelyne BalintForrest, Dorianne Perret Shanta 11/16/2015, 1:11 PM

## 2015-11-17 MED ORDER — LORAZEPAM 1 MG PO TABS
1.0000 mg | ORAL_TABLET | Freq: Every day | ORAL | Status: AC
  Administered 2015-11-20: 1 mg via ORAL
  Filled 2015-11-17: qty 1

## 2015-11-17 MED ORDER — LORAZEPAM 1 MG PO TABS
1.0000 mg | ORAL_TABLET | Freq: Three times a day (TID) | ORAL | Status: AC
  Administered 2015-11-18 (×3): 1 mg via ORAL
  Filled 2015-11-17 (×3): qty 1

## 2015-11-17 MED ORDER — LORAZEPAM 1 MG PO TABS: 1.0000 mg | ORAL_TABLET | Freq: Four times a day (QID) | ORAL | Status: AC | PRN

## 2015-11-17 MED ORDER — LORAZEPAM 1 MG PO TABS
1.0000 mg | ORAL_TABLET | Freq: Two times a day (BID) | ORAL | Status: AC
  Administered 2015-11-19 (×2): 1 mg via ORAL
  Filled 2015-11-17 (×2): qty 1

## 2015-11-17 MED ORDER — LOPERAMIDE HCL 2 MG PO CAPS: 2.0000 mg | ORAL_CAPSULE | ORAL | Status: AC | PRN

## 2015-11-17 MED ORDER — CHLORDIAZEPOXIDE HCL 25 MG PO CAPS: 25.0000 mg | ORAL_CAPSULE | Freq: Three times a day (TID) | ORAL | Status: DC | PRN

## 2015-11-17 MED ORDER — ONDANSETRON 4 MG PO TBDP: 4.0000 mg | ORAL_TABLET | Freq: Four times a day (QID) | ORAL | Status: AC | PRN

## 2015-11-17 MED ORDER — LORAZEPAM 1 MG PO TABS
1.0000 mg | ORAL_TABLET | Freq: Four times a day (QID) | ORAL | Status: AC
  Administered 2015-11-17 (×3): 1 mg via ORAL
  Filled 2015-11-17 (×3): qty 1

## 2015-11-17 MED ORDER — HYDROXYZINE HCL 25 MG PO TABS
25.0000 mg | ORAL_TABLET | Freq: Four times a day (QID) | ORAL | Status: DC | PRN
  Administered 2015-11-21 – 2015-11-24 (×3): 25 mg via ORAL
  Filled 2015-11-17 (×2): qty 1
  Filled 2015-11-17: qty 10
  Filled 2015-11-17: qty 1

## 2015-11-17 NOTE — Plan of Care (Signed)
Problem: Alteration in mood & ability to function due to Goal: STG-Patient will attend groups Outcome: Progressing Pt attended evening AA group     

## 2015-11-17 NOTE — Progress Notes (Signed)
Patient attended AA group.   

## 2015-11-17 NOTE — Progress Notes (Signed)
[facility] MD Progress Note  11/17/2015  Amanda Huff  MRN:  [id number]   Subjective:  I still feel very nervous anxious .  I cannot sleep well.  I have a lot of racing thoughts.  I'm still very depressed.  Objective: Pt seen and chart reviewed.  Patient remains very anxious, labile, tearful and continued to endorse depression and anxiety symptoms.  She endorse poor sleep .  She has limited participation in the groups.  She admitted Christmas and holidays are making her very sad depressed and anxious.  She admitted drinking alcohol heavily.  She has tremors shakes.  She is not aggressive but remains very labile and anxious.    Principal Problem: Alcohol use disorder, severe, dependence (HCC) Diagnosis:   Patient Active Problem List   Diagnosis Date Noted  . Alcohol use disorder, severe, dependence (HCC) [F10.20] 11/11/2015  . 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 Depression, major, recurrent, moderate (HCC) [F33.1] 10/29/2015   Total Time spent with patient: 15 minutes  Past Psychiatric History:  See above  Past Medical History:  Past Medical History  Diagnosis Date  . ETOH abuse     Past Surgical History  Procedure Laterality Date  . Cesarean section     Family History: History reviewed. No pertinent family history. Family Psychiatric  History:  None  Social History:  History  Alcohol Use  . Yes    Comment: occasionally     History  Drug Use No    Social History   Social History  . Marital Status: Single    Spouse Name: N/A  . Number of Children: N/A  . Years of Education: N/A   Social History Main Topics  . Smoking status: Current Every Day Smoker -- 0.50 packs/day    Types: Cigarettes  . Smokeless tobacco: None  . Alcohol Use: Yes     Comment: occasionally  . Drug Use: No  . Sexual Activity: No   Other Topics Concern  . None   Social History Narrative   Additional Social History:   Sleep: Fair  Appetite:   Fair  Current Medications: Current Facility-Administered Medications  Medication Dose Route Frequency Provider Last Rate Last Dose  . acetaminophen (TYLENOL) tablet 650 mg  650 mg Oral Q6H PRN Earney Navy, NP   650 mg at 11/17/15 1143  . alum & mag hydroxide-simeth (MAALOX/MYLANTA) 200-200-20 MG/5ML suspension 30 mL  30 mL Oral Q4H PRN Earney Navy, NP      . aspirin-acetaminophen-caffeine (EXCEDRIN MIGRAINE) per tablet 2 tablet  2 tablet Oral Q6H PRN Rachael Fee, MD   2 tablet at 11/16/15 [phone]  . benzocaine (ORAJEL) 10 % mucosal gel   Mouth/Throat QID PRN Rachael Fee, MD      . FLUoxetine (PROZAC) capsule 20 mg  20 mg Oral Daily Earney Navy, NP   20 mg at 11/16/15 0803  . hydrOXYzine (ATARAX/VISTARIL) tablet 25 mg  25 mg Oral Q6H PRN Cleotis Nipper, MD      . ibuprofen (ADVIL,MOTRIN) tablet 600 mg  600 mg Oral Once Kerry Hough, PA-C   600 mg at 11/12/15 0414  . magnesium hydroxide (MILK OF MAGNESIA) suspension 30 mL  30 mL Oral Daily PRN Earney Navy, NP   30 mL at 11/16/15 1646  . multivitamin with minerals tablet 1 tablet  1 tablet Oral Daily Beau Fanny, FNP   1 tablet at 11/17/15 0825  . nicotine (NICODERM CQ -  dosed in mg/24 hours) patch 21 mg  21 mg Transdermal Daily Rachael FeeIrving A Lugo, MD   21 mg at 11/16/15 0803  . thiamine (VITAMIN B-1) tablet 100 mg  100 mg Oral Daily Earney NavyJosephine C Onuoha, NP   100 mg at 11/17/15 0825   Or  . thiamine (B-1) injection 100 mg  100 mg Intravenous Daily Earney NavyJosephine C Onuoha, NP      . traMADol (ULTRAM) tablet 50 mg  50 mg Oral QHS Rachael FeeIrving A Lugo, MD   50 mg at 11/16/15 2108  . traZODone (DESYREL) tablet 150 mg  150 mg Oral QHS Beau FannyJohn C Withrow, FNP   150 mg at 11/16/15 2108    Lab Results: No results found for this or any previous visit (from the past 48 hour(s)).  Physical Findings: AIMS: Facial and Oral Movements Muscles of Facial Expression: None, normal Lips and Perioral Area: None, normal Jaw: None, normal Tongue:  None, normal,Extremity Movements Upper (arms, wrists, hands, fingers): None, normal Lower (legs, knees, ankles, toes): None, normal, Trunk Movements Neck, shoulders, hips: None, normal, Overall Severity Severity of abnormal movements (highest score from questions above): None, normal Incapacitation due to abnormal movements: None, normal Patient's awareness of abnormal movements (rate only patient's report): No Awareness, Dental Status Current problems with teeth and/or dentures?: Yes Does patient usually wear dentures?: No  CIWA:  CIWA-Ar Total: 0 COWS:     Musculoskeletal: Strength & Muscle Tone: within normal limits Gait & Station: normal Patient leans: N/A  Psychiatric Specialty Exam: Review of Systems  Neurological: Positive for tremors.  Psychiatric/Behavioral: Positive for depression and substance abuse. Negative for suicidal ideas and hallucinations. The patient is nervous/anxious and has insomnia.   All other systems reviewed and are negative.   Blood pressure 109/67, pulse 101, temperature 97.4 F (36.3 C), temperature source Oral, resp. rate 16, height 5\' 7"  (1.702 m), weight 58.968 kg (130 lb), SpO2 97 %.Body mass index is 20.36 kg/(m^2).   General Appearance: Fairly groomed  Patent attorneyye Contact:: Fair  Speech: Clear and Coherent  Volume: Decreased  Mood: Anxious, depressed  Affect: anxious worried  Thought Process: Coherent and Goal Directed  Orientation: Full (Time, Place, and Person)  Thought Content: Rumination, focused on discharge, very tearful during assessment  Suicidal Thoughts: No  Homicidal Thoughts: No  Memory: Immediate; Fair Recent; Fair Remote; Fair  Judgement: Fair  Insight: Present and Shallow  Psychomotor Activity: Restlessness  Concentration: Fair  Recall: Poor  Fund of Knowledge:Fair  Language: Fair  Akathisia: No  Handed: Right  AIMS (if indicated):    Assets: Desire for Improvement  ADL's:  Intact  Cognition: WNL  Sleep: Number of Hours: 5         Treatment Plan Summary: Daily contact with patient to assess and evaluate symptoms and progress in treatment,  Labs reviewed.  AST 43.  Encouraged to participate in group milieu therapy.  Discussed medication side effects and benefits.  Patient at this time does not have any side effects.  Medications:  -Continue Ativan detox protocol -Continue Prozac 20mg  daily for MDD -Continue Trazodone to 150mg  qhs prn insomnia -Continue Tramadol 50mg  qhs for chronic pain at night  Jazmyn Offner T., MD 11/17/2015,

## 2015-11-17 NOTE — [facility] Group Notes (Signed)
[facility] LCSW Group Therapy  11/17/2015 12:45 PM  Type of Therapy:  Group Therapy  Participation Level:  Did Not Attend-pt invited. Chose to remain in bed.   Modes of Intervention:  Confrontation, Discussion, Education, Exploration, Problem-solving, Rapport Building, Socialization and Support  Summary of Progress/Problems: Today's Topic: Overcoming Obstacles. Patients identified one short term goal and potential obstacles in reaching this goal. Patients processed barriers involved in overcoming these obstacles. Patients identified steps necessary for overcoming these obstacles and explored motivation (internal and external) for facing these difficulties head on.   Smart, Jacson Rapaport LCSW 11/17/2015, 12:45 PM
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
## 2015-11-17 NOTE — Progress Notes (Signed)
DAR Note: Amanda Huff has been in her room much of the day.  She is very tearful and reports that she didn't sleep that well.  "I just don't feel good today.  I am not going to take the medication because I feel like it has made me worse and I cannot afford to take the medication when I leave."  Encouraged her to talk with the doctor about her concerns.  She denies any SI/HI or A/V hallucinations.  She completed her self inventory and reports that her depression is 8/10, hopelessness 9/10 and anxiety is 8/10.  She states that her goal for today is to "go home."  She remains very sad and tearful.  She was started on the ativan protocol and she is hoping that this will help with her anxiety.  "I didn't really drink that much but I am shaking really bad."  Encouraged continued participation in group and unit activities.  Q 15 minute checks maintained for safety.  We will continue to monitor the progress towards her goals.

## 2015-11-17 NOTE — [facility] Group Notes (Signed)
[facility]   11/17/2015 10:43 AM  Participation Quality:  Invited. DID NOT ATTEND. Pt chose to remain in bed.   Smart, Demecia Northway LCSW

## 2015-11-17 NOTE — Progress Notes (Signed)
Patient ID: Amanda Huff, female   DOB: Oct 11, 1961, 54 y.o.   MRN: [id number] D: Patient in dayroom interacting and playing cards with peers. Pt c/o headache rated as 7 out 10 scale. Pt reports she is tolerating medication well. Pt denies SI/HI/AVH. Pt attended and participated in [place] up group. Cooperative with assessment. No acute physical distress noted.  A: Met with pt 1:1. Medications administered as prescribed. Support and encouragement provided.  R: Patient remains safe and complaint with medications. Rated pain as 0 on reassessment.

## 2015-11-18 DIAGNOSIS — R45851 Suicidal ideations: Secondary | ICD-10-CM
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
## 2015-11-18 NOTE — Plan of Care (Signed)
Problem: Alteration in mood Goal: LTG-Patient reports reduction in suicidal thoughts (Patient reports reduction in suicidal thoughts and is able to verbalize a safety plan for whenever patient is feeling suicidal)  Outcome: Progressing Nurse discussed suicidal thoughts/depression/coping skills with patient.        

## 2015-11-18 NOTE — BHH Group Notes (Signed)
The focus of this group is to educate the patient on the purpose and policies of crisis stabilization and provide a format to answer questions about their admission.  The group details unit policies and expectations of patients while admitted.  Patient attended 0900 nurse education orientation group this morning.  Patient actively participated and had appropriate affect.  Patient is alert.  Patient had appropriate insight and appropriate engagement.  Today patient will work on 3 goals for discharge.  

## 2015-11-18 NOTE — Progress Notes (Signed)
Recreation Therapy Notes  Animal-Assisted Activity (AAA) Program Checklist/Progress Notes Patient Eligibility Criteria Checklist & Daily Group note for Rec Tx Intervention  Date: 12.20.2016  Time: 2:45pm [facility] [address]    AAA/T Program Assumption of Risk Form signed by Patient/ or Parent Legal Guardian yes  Patient is free of allergies or sever asthma yes  Patient reports no fear of animals yes  Patient reports no history of cruelty to animals yes  Patient understands his/her participation is voluntary yes  Behavioral Response: Did not attend  Hexion Specialty ChemicalsDenise L Jaquavis Felmlee, LRT/CTRS         Ulyess Muto L 11/18/2015 3:01 PM

## 2015-11-18 NOTE — Progress Notes (Signed)
D:  Patient's self inventory sheet, patient has poor sleep, sleep medication is not helpful.  Good appetite, low energy level, poor concentration.  Rated depression and hopeless 7, anxiety 6.  Withdrawals, tremors, diarrhea, chilling, cravings.  Denied SI.   Physical problems lightheaded, headaches.  Physical pain, head, hip, sciatica/nerve pain in R leg, worst pain #9 in past 24 hours.  Goal is to work toward home.  Stated she could not sleep last night.  New roommate snores very loudly.  Could hear her even with earplugs.  No discharge plan. A:  Medications administered per MD orders.  Emotional support and encouragement given patient. R:  Denied SI and HI, contracts for safety.  Denied A/V hallucinations.  Safety maintained with 15 minute checks.

## 2015-11-18 NOTE — Progress Notes (Signed)
[facility]  11/18/2015  Rolley SimsLisa Zuccaro  MRN:  [id number]   Subjective:  My shakes are getting better .  I still feel depressed .    Objective: Pt seen and chart reviewed.  She is less tremulous and less complaining of shakes.  However she is still very depressed, tearful and labile.  When I ask if she is taking Prozac she replied that she is not taking Prozac because she is afraid once she leaves the hospital she could not afford taking medication.  She is concerned about transportation and living by herself.  She has no means for doctor's appointment .  She is easily tearful and crying about her social situation.  She admitted continues to have passive and fleeting suicidal thoughts and she feels very overwhelmed.  She endorse anhedonia and hopelessness.  She sleeping on and off.  She is not aggressive but appears very depressed and dysphoric .  She is going to the groups but limited participation.  Principal Problem: Alcohol use disorder, severe, dependence (HCC), major depressive disorder, recurrent Diagnosis:   Patient Active Problem List   Diagnosis Date Noted  . Alcohol use disorder, severe, dependence (HCC) [F10.20] 11/11/2015  . 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 Depression, major, recurrent, moderate (HCC) [F33.1] 10/29/2015   Total Time spent with patient: 15 minutes  Past Psychiatric History:  See above  Past Medical History:  Past Medical History  Diagnosis Date  . ETOH abuse     Past Surgical History  Procedure Laterality Date  . Cesarean section     Family History: History reviewed. No pertinent family history. Family Psychiatric  History:  None  Social History:  History  Alcohol Use  . Yes    Comment: occasionally     History  Drug Use No    Social History   Social History  . Marital Status: Single    Spouse Name: N/A  . Number of Children: N/A  . Years of Education: N/A   Social History Main Topics   . Smoking status: Current Every Day Smoker -- 0.50 packs/day    Types: Cigarettes  . Smokeless tobacco: None  . Alcohol Use: Yes     Comment: occasionally  . Drug Use: No  . Sexual Activity: No   Other Topics Concern  . None   Social History Narrative   Additional Social History:   Sleep: Fair  Appetite:  Fair  Current Medications: Current Facility-Administered Medications  Medication Dose Route Frequency Provider Last Rate Last Dose  . acetaminophen (TYLENOL) tablet 650 mg  650 mg Oral Q6H PRN Earney NavyJosephine C Onuoha, NP   650 mg at 11/18/15 [id number]  . alum & mag hydroxide-simeth (MAALOX/MYLANTA) 200-200-20 MG/5ML suspension 30 mL  30 mL Oral Q4H PRN Earney NavyJosephine C Onuoha, NP      . aspirin-acetaminophen-caffeine (EXCEDRIN MIGRAINE) per tablet 2 tablet  2 tablet Oral Q6H PRN Rachael FeeIrving A Lugo, MD   2 tablet at 11/18/15 1040  . benzocaine (ORAJEL) 10 % mucosal gel   Mouth/Throat QID PRN Rachael FeeIrving A Lugo, MD      . FLUoxetine (PROZAC) capsule 20 mg  20 mg Oral Daily Earney NavyJosephine C Onuoha, NP   20 mg at 11/16/15 0803  . hydrOXYzine (ATARAX/VISTARIL) tablet 25 mg  25 mg Oral Q6H PRN Cleotis NipperSyed T Rhyder Koegel, MD      . ibuprofen (ADVIL,MOTRIN) tablet 600 mg  600 mg Oral Once Kerry HoughSpencer E Simon, PA-C   600 mg at 11/12/15  1478  . loperamide (IMODIUM) capsule 2-4 mg  2-4 mg Oral PRN Cleotis Nipper, MD      . LORazepam (ATIVAN) tablet 1 mg  1 mg Oral Q6H PRN Cleotis Nipper, MD      . LORazepam (ATIVAN) tablet 1 mg  1 mg Oral TID Cleotis Nipper, MD   1 mg at 11/18/15 1214   Followed by  . [START ON 11/19/2015] LORazepam (ATIVAN) tablet 1 mg  1 mg Oral BID Cleotis Nipper, MD       Followed by  . [START ON 11/20/2015] LORazepam (ATIVAN) tablet 1 mg  1 mg Oral Daily Cleotis Nipper, MD      . magnesium hydroxide (MILK OF MAGNESIA) suspension 30 mL  30 mL Oral Daily PRN Earney Navy, NP   30 mL at 11/16/15 1646  . multivitamin with minerals tablet 1 tablet  1 tablet Oral Daily Beau Fanny, FNP   1 tablet at 11/18/15 [phone]   . nicotine (NICODERM CQ - dosed in mg/24 hours) patch 21 mg  21 mg Transdermal Daily Rachael Fee, MD   21 mg at 11/18/15 0749  . ondansetron (ZOFRAN-ODT) disintegrating tablet 4 mg  4 mg Oral Q6H PRN Cleotis Nipper, MD      . thiamine (VITAMIN B-1) tablet 100 mg  100 mg Oral Daily Earney Navy, NP   100 mg at 11/18/15 0748  . traMADol (ULTRAM) tablet 50 mg  50 mg Oral QHS Rachael Fee, MD   50 mg at 11/16/15 2108  . traZODone (DESYREL) tablet 150 mg  150 mg Oral QHS Beau Fanny, FNP   150 mg at 11/17/15 2231    Lab Results: No results found for this or any previous visit (from the past 48 hour(s)).  Physical Findings: AIMS: Facial and Oral Movements Muscles of Facial Expression: None, normal Lips and Perioral Area: None, normal Jaw: None, normal Tongue: None, normal,Extremity Movements Upper (arms, wrists, hands, fingers): None, normal Lower (legs, knees, ankles, toes): None, normal, Trunk Movements Neck, shoulders, hips: None, normal, Overall Severity Severity of abnormal movements (highest score from questions above): None, normal Incapacitation due to abnormal movements: None, normal Patient's awareness of abnormal movements (rate only patient's report): No Awareness, Dental Status Current problems with teeth and/or dentures?: Yes Does patient usually wear dentures?: No  CIWA:  CIWA-Ar Total: 1 COWS:  COWS Total Score: 3  Musculoskeletal: Strength & Muscle Tone: within normal limits Gait & Station: normal Patient leans: N/A  Psychiatric Specialty Exam: Review of Systems  Neurological: Positive for tremors.  Psychiatric/Behavioral: Positive for depression and substance abuse. Negative for suicidal ideas and hallucinations. The patient is nervous/anxious and has insomnia.   All other systems reviewed and are negative.   Blood pressure 131/71, pulse 81, temperature 97.9 F (36.6 C), temperature source Oral, resp. rate 16, height  (1.702 m), weight 58.968 kg  (130 lb), SpO2 97 %.Body mass index is 20.36 kg/(m^2).   General Appearance: Fairly groomed  Patent attorney:: Fair  Speech: Clear and Coherent  Volume: Decreased  Mood: Anxious, depressed  Affect: anxious worried  Thought Process: Coherent and Goal Directed  Orientation: Full (Time, Place, and Person)  Thought Content: Rumination  Suicidal Thoughts: Yes, passive and fleeting thoughts but no plan   Homicidal Thoughts: No  Memory: Immediate; Fair Recent; Fair Remote; Fair  Judgement: Fair  Insight: Present and Shallow  Psychomotor Activity: Restlessness  Concentration: Fair  Recall: Poor  [employer]  of Knowledge:Fair  Language: Fair  Akathisia: No  Handed: Right  AIMS (if indicated):    Assets: Desire for Improvement  ADL's: Intact  Cognition: WNL  Sleep: Number of Hours: 5         Treatment Plan Summary: Daily contact with patient to assess and evaluate symptoms and progress in treatment,  Encouraged to take her medication to help the depression and anxiety symptoms.  Discuss appropriate discharge planning and to get means and resources so she can continue to follow up with clinician and medication management.  Labs reviewed.  AST 43.  Encouraged to participate in group milieu therapy.  Discussed medication side effects and benefits.  Patient at this time does not have any side effects.  Medications:  -Continue Ativan detox protocol -Continue Prozac  daily for MDD -Continue Trazodone to  qhs prn insomnia -Continue Tramadol  qhs for chronic pain at night  Levia Waltermire T., MD 11/18/2015,

## 2015-11-18 NOTE — Progress Notes (Signed)
Adult Psychoeducational Group Note  Date:  11/18/2015 Time:  8: 45 PM  Group Topic/Focus:  Wrap-Up Group:   The focus of this group is to help patients review their daily goal of treatment and discuss progress on daily workbooks.  Participation Level:  Did Not Attend  Pt was encouraged, but did not attend wrap-up group.    Cleotilde NeerJasmine S Elieser Tetrick 11/18/2015, 9:44 PM

## 2015-11-19 MED ORDER — TRAZODONE HCL 100 MG PO TABS
200.0000 mg | ORAL_TABLET | Freq: Every day | ORAL | Status: DC
Start: 2015-11-19 — End: 2015-11-22
  Administered 2015-11-19 – 2015-11-21 (×3): 200 mg via ORAL
  Filled 2015-11-19 (×4): qty 2

## 2015-11-19 NOTE — Progress Notes (Signed)
Recreation Therapy Notes  Date: 12.21.2016  Time: 9:30am Location: [address] Group room   Group Topic: Stress Management  Goal Area(s) Addresses:  Patient will actively participate in stress management techniques presented during session.   Behavioral Response: Did not attend.   Taygen Newsome L Jacon Whetzel, LRT/CTRS        Deny Chevez L 11/19/2015 11:39 AM 

## 2015-11-19 NOTE — Plan of Care (Signed)
Problem: Alteration in mood & ability to function due to Goal: LTG-Pt reports reduction in suicidal thoughts (Patient reports reduction in suicidal thoughts and is able to verbalize a safety plan for whenever patient is feeling suicidal)  Outcome: Progressing Nurse discussed suicidal thoughts/ depression/ coping skills with patient.

## 2015-11-19 NOTE — Progress Notes (Addendum)
D:  Patient's self inventory sheet, patient has poor sleep.  Sleep medication was not helpful.  "Roommate snores loudly.  I use earplugs but it does not help.  Also, she doesn't flush toilet so it always smells like urine."  Good appetite, low energy level, good concentration.  Rated depression, hopeless and anxiety 8.  Withdrawals, diarrhea,, chilling, cravings, cramping, agitation in past 24 hours.  Denied SI.  Physical problems, lightheaded, pain, dizzy.  Physical pain, worst pain in past 24 hrs is #8, hip, right side, sciatic, headache.  No pain medication.  Discharge plan exit to return home.  No discharge plans. A:  Medications administered per MD orders.  Emotional support and encouragement given patient. R:  Patient denied SI and HI, contracts for safety.  Denied A/V hallucinations.  Safety maintained with 15 minute checks.  Patient has denied pain/distress while talking to nurse this morning and afternoon.  Patient has declined any prn medications for pain, etc.  Patient stated she will tell nurse if she needs prn's.  Patient has been cooperative and pleasant today.

## 2015-11-19 NOTE — Progress Notes (Signed)
D: Patient alert and oriented x 4. Patient denies pain/SI/HI/AVH. Patient voiced concern over possible discharge 11/19/15. Patient states, "I feel like I am not ready yet, I am afraid I will go back to my habits." This writer advised patient to voice these concerns to her doctor tomorrow.  Patient became tearful during assessment.  A: Staff to monitor Q 15 mins for safety. Encouragement and support offered. Scheduled medications administered per orders. R: Patient remains safe on the unit. Patient attended group tonight. Patient visible on hte unit and interacting with peers. Patient taking administered medications.

## 2015-11-19 NOTE — Progress Notes (Signed)
[facility]  11/19/2015  Amanda Huff  MRN:  [id number]   Subjective:  I am not sleeping good.  I have a lot of racing thoughts.  I still feel depressed .    Objective: Pt seen and chart reviewed.  She continued to endorse insomnia , feeling depressed sad and tearful.  However her shakes are less intense and less frequent.  She is less tremulous.  She is going to groups but she has limited participation.  She still feel hopeless helpless and worthless.  She started taking Prozac since yesterday and she is tolerating medication without any side effects.  She continues to have concern about her living situation as she lives by herself.  She continued to endorse passive and fleeting suicidal thoughts but denies any plan.  She agreed to continue medication upon discharge and liked to follow up with psychiatrist and therapist.  She is working on her transportation issues.  She spoke to her mother who lives in [place] and she is concerned about her physical health.  Principal Problem: Alcohol use disorder, severe, dependence (HCC), major depressive disorder, recurrent Diagnosis:   Patient Active Problem List   Diagnosis Date Noted  . Alcohol use disorder, severe, dependence (HCC) [F10.20] 11/11/2015  . 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 Depression, major, recurrent, moderate (HCC) [F33.1] 10/29/2015   Total Time spent with patient: 15 minutes  Past Psychiatric History:  See above  Past Medical History:  Past Medical History  Diagnosis Date  . ETOH abuse     Past Surgical History  Procedure Laterality Date  . Cesarean section     Family History: History reviewed. No pertinent family history. Family Psychiatric  History:  None  Social History:  History  Alcohol Use  . Yes    Comment: occasionally     History  Drug Use No    Social History   Social History  . Marital Status: Single    Spouse Name: N/A  . Number of  Children: N/A  . Years of Education: N/A   Social History Main Topics  . Smoking status: Current Every Day Smoker -- 0.50 packs/day    Types: Cigarettes  . Smokeless tobacco: None  . Alcohol Use: Yes     Comment: occasionally  . Drug Use: No  . Sexual Activity: No   Other Topics Concern  . None   Social History Narrative   Additional Social History:   Sleep: Fair  Appetite:  Fair  Current Medications: Current Facility-Administered Medications  Medication Dose Route Frequency Provider Last Rate Last Dose  . acetaminophen (TYLENOL) tablet 650 mg  650 mg Oral Q6H PRN Earney NavyJosephine C Onuoha, NP   650 mg at 11/18/15 [id number]  . alum & mag hydroxide-simeth (MAALOX/MYLANTA) 200-200-20 MG/5ML suspension 30 mL  30 mL Oral Q4H PRN Earney NavyJosephine C Onuoha, NP      . aspirin-acetaminophen-caffeine (EXCEDRIN MIGRAINE) per tablet 2 tablet  2 tablet Oral Q6H PRN Rachael FeeIrving A Lugo, MD   2 tablet at 11/19/15 0636  . benzocaine (ORAJEL) 10 % mucosal gel   Mouth/Throat QID PRN Rachael FeeIrving A Lugo, MD      . FLUoxetine (PROZAC) capsule 20 mg  20 mg Oral Daily Earney NavyJosephine C Onuoha, NP   20 mg at 11/19/15 0749  . hydrOXYzine (ATARAX/VISTARIL) tablet 25 mg  25 mg Oral Q6H PRN Cleotis NipperSyed T Jatziri Goffredo, MD      . ibuprofen (ADVIL,MOTRIN) tablet 600 mg  600 mg Oral Once  Kerry Hough, PA-C   600 mg at 11/12/15 0414  . loperamide (IMODIUM) capsule 2-4 mg  2-4 mg Oral PRN Cleotis Nipper, MD      . LORazepam (ATIVAN) tablet 1 mg  1 mg Oral Q6H PRN Cleotis Nipper, MD      . LORazepam (ATIVAN) tablet 1 mg  1 mg Oral BID Cleotis Nipper, MD   1 mg at 11/19/15 0753   Followed by  . [START ON 11/20/2015] LORazepam (ATIVAN) tablet 1 mg  1 mg Oral Daily Cleotis Nipper, MD      . magnesium hydroxide (MILK OF MAGNESIA) suspension 30 mL  30 mL Oral Daily PRN Earney Navy, NP   30 mL at 11/16/15 1646  . multivitamin with minerals tablet 1 tablet  1 tablet Oral Daily Beau Fanny, FNP   1 tablet at 11/19/15 0750  . nicotine (NICODERM CQ - dosed in  mg/24 hours) patch 21 mg  21 mg Transdermal Daily Rachael Fee, MD   21 mg at 11/19/15 0750  . ondansetron (ZOFRAN-ODT) disintegrating tablet 4 mg  4 mg Oral Q6H PRN Cleotis Nipper, MD      . thiamine (VITAMIN B-1) tablet 100 mg  100 mg Oral Daily Earney Navy, NP   100 mg at 11/19/15 0751  . traMADol (ULTRAM) tablet 50 mg  50 mg Oral QHS Rachael Fee, MD   50 mg at 11/18/15 2130  . traZODone (DESYREL) tablet 200 mg  200 mg Oral QHS Cleotis Nipper, MD        Lab Results: No results found for this or any previous visit (from the past 48 hour(s)).  Physical Findings: AIMS: Facial and Oral Movements Muscles of Facial Expression: None, normal Lips and Perioral Area: None, normal Jaw: None, normal Tongue: None, normal,Extremity Movements Upper (arms, wrists, hands, fingers): None, normal Lower (legs, knees, ankles, toes): None, normal, Trunk Movements Neck, shoulders, hips: None, normal, Overall Severity Severity of abnormal movements (highest score from questions above): None, normal Incapacitation due to abnormal movements: None, normal Patient's awareness of abnormal movements (rate only patient's report): No Awareness, Dental Status Current problems with teeth and/or dentures?: Yes Does patient usually wear dentures?: No  CIWA:  CIWA-Ar Total: 1 COWS:  COWS Total Score: 2  Musculoskeletal: Strength & Muscle Tone: within normal limits Gait & Station: normal Patient leans: N/A  Psychiatric Specialty Exam: Review of Systems  Neurological: Positive for tremors.  Psychiatric/Behavioral: Positive for depression and substance abuse. Negative for suicidal ideas and hallucinations. The patient is nervous/anxious and has insomnia.   All other systems reviewed and are negative.   Blood pressure 105/66, pulse 88, temperature 97.9 F (36.6 C), temperature source Oral, resp. rate 16, height  (1.702 m), weight 58.968 kg (130 lb), SpO2 97 %.Body mass index is 20.36 kg/(m^2).    General Appearance: Fairly groomed  Patent attorney:: Fair  Speech: Clear and Coherent  Volume: Decreased  Mood: Anxious, depressed  Affect: anxious worried  Thought Process: Coherent and Goal Directed  Orientation: Full (Time, Place, and Person)  Thought Content: Rumination  Suicidal Thoughts: Yes, passive and fleeting thoughts but no plan   Homicidal Thoughts: No  Memory: Immediate; Fair Recent; Fair Remote; Fair  Judgement: Fair  Insight: Present and Shallow  Psychomotor Activity: Restlessness  Concentration: Fair  Recall: Poor  Fund of Knowledge:Fair  Language: Fair  Akathisia: No  Handed: Right  AIMS (if indicated):    Assets: Desire  for Improvement  ADL's: Intact  Cognition: WNL  Sleep: Number of Hours: 5         Treatment Plan Summary: Daily contact with patient to assess and evaluate symptoms and progress in treatment,  Increase trazodone 200 mg at bedtime and encourage to take her medication to help the depression and anxiety symptoms.  Discuss appropriate discharge planning and to get means and resources so she can continue to follow up with clinician and medication management.  Labs reviewed.  AST 43.  Encouraged to participate in group milieu therapy.  Discussed medication side effects and benefits.  Patient at this time does not have any side effects.  Medications:  -Continue Ativan detox protocol -Continue Prozac  daily for MDD -Increase trazodone 200 mg at bedtime.   -Continue Tramadol  qhs for chronic pain at night  Kadyn Guild T., MD 11/19/2015,

## 2015-11-19 NOTE — [facility] Group Notes (Signed)
[facility] LCSW Group Therapy 11/19/2015  1:15 PM Type of Therapy: Group Therapy Participation Level: Minimal  Participation Quality: Minimal  Affect: Depressed and Flat  Cognitive: Alert and Oriented  Insight: Developing/Improving and Engaged  Engagement in Therapy: Developing/Improving and Engaged  Modes of Intervention: Clarification, Confrontation, Discussion, Education, Exploration, Limit-setting, Orientation, Problem-solving, Rapport Building, Dance movement psychotherapisteality Testing, Socialization and Support  Summary of Progress/Problems: The topic for group today was emotional regulation. This group focused on both positive and negative emotion identification and allowed group members to process ways to identify feelings, regulate negative emotions, and find healthy ways to manage internal/external emotions. Group members were asked to reflect on a time when their reaction to an emotion led to a negative outcome and explored how alternative responses using emotion regulation would have benefited them. Group members were also asked to discuss a time when emotion regulation was utilized when a negative emotion was experienced. Patient did not participate in discussion and left group early without returning.  Amanda BruinKristin Aleigh Huff, MSW, [employer]CS[employer] Clinical Social Worker [facility] [phone]

## 2015-11-19 NOTE — [facility] Group Notes (Signed)
[facility] LCSW Aftercare Discharge Planning Group Note  11/19/2015  8:45 AM  Participation Quality: Did Not Attend. Patient invited to participate but declined.  Lular Letson, MSW, LCSWA Clinical Social Worker [facility] [phone]   

## 2015-11-19 NOTE — [facility] Group Notes (Signed)
[facility] LCSW Group Therapy 11/18/15  1:15 PM   Type of Therapy: Group Therapy  Participation Level: Did Not Attend. Patient invited to participate but declined.   Brihana Quickel, MSW, LCSWA Clinical Social Worker [facility] [phone]   

## 2015-11-19 NOTE — Progress Notes (Signed)
Patient did not attend NA group meeting tonight.  

## 2015-11-20 MED ORDER — RISPERIDONE 0.5 MG PO TABS
0.5000 mg | ORAL_TABLET | Freq: Every day | ORAL | Status: DC
  Administered 2015-11-20: 0.5 mg via ORAL
  Filled 2015-11-20 (×3): qty 1

## 2015-11-20 NOTE — Progress Notes (Signed)
At the beginning of shift, pt was tearful, reporting that she was having severe pain with her abcess tooth, which was causing her headache to return.  Pt denies SI/HI/AVH at this time, but her depression/anxiety level is still high.  She was given pain medication along with Oragel for her tooth.  After an hour, she said her pain was much relieved. She reports that she will be going to [facility] at discharge and had a visit today from a staff member today from that facility.  Pt has been pleasant and cooperative, and not as needy this evening.  Pt makes her needs known to staff.  Support and encouragement offered.  Safety maintained with q15 minute checks.

## 2015-11-20 NOTE — Plan of Care (Signed)
Problem: Alteration in mood & ability to function due to Goal: STG-Patient will comply with prescribed medication regimen (Patient will comply with prescribed medication regimen)  Outcome: Progressing Pt compliant with prescribed medication regimen.      

## 2015-11-20 NOTE — Progress Notes (Signed)
Pt reports she has not had a good day.  She has c/o pain in his back and a lingering migraine for which she is receiving Excedrin Migraine.  She is not c/o withdrawal symptoms.  She seems very down and her mood is depressed/negative.  She did not attend evening group, but she was in and out of the dayroom at other times.  She denies SI/HI/AVH.  She is unsure of her discharge plans at this time.  She is also asking for additional sleep meds as her says her roommate snores and it is difficult for her to sleep.  She was provided ear plugs last night.  She was also informed that her sleep med was increased from last night.  Pt makes her needs known to staff.  Support and encouragement offered.  Safety maintained with q15 minute checks.

## 2015-11-20 NOTE — [facility] Group Notes (Signed)
[facility] LCSW Group Therapy 11/20/2015  1:15 PM   Type of Therapy: Group Therapy  Participation Level: Did Not Attend. Patient invited to participate but declined.   Simran Bomkamp, MSW, LCSWA Clinical Social Worker [facility] [phone]   

## 2015-11-20 NOTE — [facility] Group Notes (Signed)
[facility] Group Notes:  (Nursing/MHT/Case Management/Adjunct)  Date:  11/20/2015  Time:  0915  Type of Therapy:  Nurse Education  Participation Level:  Did Not Attend    Summary of Progress/Problems:  Dara Hoyershley N Judas Mohammad 11/20/2015, 10:32 AM

## 2015-11-20 NOTE — Progress Notes (Signed)
D: Per patient self inventory form pt reports she slept poor with the use of sleep medication. She reports a good appetite, low energy level, poor concentration. Pt rates depression 9/10, hoeplessness 9/10, anxiety 7/10- all on 0-10 scale, 10 being the worse. Pt denies SI/HI. Denies AVH. Pt c/o tooth pain. Pt reports her goal for the day is "going home" Pt presents with anxiety as a withdrawal symptom. Tonya from [facility] at facility asking questions in regards to pt POC and requesting to speak with MD. Pt aware and consents signed.   A:Special checks q 15 mins in place for safety.Medication administered per MD order (see eMAR). Encouragement and support provided. Belenda CruiseKristin SW notified of above, note left for MD.   R:Safety maintained. Compliant with medication regimen. Will continue to monitor.

## 2015-11-20 NOTE — Progress Notes (Signed)
[facility] MD Progress Note  11/20/2015  Amanda Huff  MRN:  [id number]   Subjective:  I have a lot of anxiety and nervousness.  I cannot sleep because I have a lot of racing thoughts.  I feel very anxious and depressed.  Objective: Pt seen and chart reviewed.  Despite increasing trazodone yesterday , she continued to endorse poor sleep, insomnia, racing thoughts and crying spells.  She admitted having panic attack this morning after she took a shower she felt better.  She feels very depressed sad and continued to endorse suicidal thoughts and have negative thinking.  Her energy level remains low.  She has difficulty in concentration and she does not go to the groups .  She is very isolated and withdrawn.  She had limited participation.  She is taking Prozac and trazodone but still feel hopeless helpless and worthless.  She is easily tearful and crying.   Though she denies any hallucination but admitted anhedonia and persistent negative and suicidal thinking.  Principal Problem: Alcohol use disorder, severe, dependence (HCC), major depressive disorder, recurrent Diagnosis:   Patient Active Problem List   Diagnosis Date Noted  . Alcohol use disorder, severe, dependence (HCC) [F10.20] 11/11/2015  . 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 Depression, major, recurrent, moderate (HCC) [F33.1] 10/29/2015   Total Time spent with patient: 15 minutes  Past Psychiatric History:  See above  Past Medical History:  Past Medical History  Diagnosis Date  . ETOH abuse     Past Surgical History  Procedure Laterality Date  . Cesarean section     Family History: History reviewed. No pertinent family history. Family Psychiatric  History:  None  Social History:  History  Alcohol Use  . Yes    Comment: occasionally     History  Drug Use No    Social History   Social History  . Marital Status: Single    Spouse Name: N/A  . Number of Children: N/A  . Years of  Education: N/A   Social History Main Topics  . Smoking status: Current Every Day Smoker -- 0.50 packs/day    Types: Cigarettes  . Smokeless tobacco: None  . Alcohol Use: Yes     Comment: occasionally  . Drug Use: No  . Sexual Activity: No   Other Topics Concern  . None   Social History Narrative   Additional Social History:   Sleep: Fair  Appetite:  Fair  Current Medications: Current Facility-Administered Medications  Medication Dose Route Frequency Provider Last Rate Last Dose  . acetaminophen (TYLENOL) tablet 650 mg  650 mg Oral Q6H PRN Earney Navy, NP   650 mg at 11/20/15 0824  . alum & mag hydroxide-simeth (MAALOX/MYLANTA) 200-200-20 MG/5ML suspension 30 mL  30 mL Oral Q4H PRN Earney Navy, NP      . aspirin-acetaminophen-caffeine (EXCEDRIN MIGRAINE) per tablet 2 tablet  2 tablet Oral Q6H PRN Rachael Fee, MD   2 tablet at 11/20/15 1321  . benzocaine (ORAJEL) 10 % mucosal gel   Mouth/Throat QID PRN Rachael Fee, MD      . FLUoxetine (PROZAC) capsule 20 mg  20 mg Oral Daily Earney Navy, NP   20 mg at 11/20/15 8469  . hydrOXYzine (ATARAX/VISTARIL) tablet 25 mg  25 mg Oral Q6H PRN Cleotis Nipper, MD      . ibuprofen (ADVIL,MOTRIN) tablet 600 mg  600 mg Oral Once Kerry Hough, PA-C   600 mg at  11/12/15 0414  . magnesium hydroxide (MILK OF MAGNESIA) suspension 30 mL  30 mL Oral Daily PRN Earney NavyJosephine C Onuoha, NP   30 mL at 11/16/15 1646  . multivitamin with minerals tablet 1 tablet  1 tablet Oral Daily Beau FannyJohn C Withrow, FNP   1 tablet at 11/20/15 16100819  . nicotine (NICODERM CQ - dosed in mg/24 hours) patch 21 mg  21 mg Transdermal Daily Rachael FeeIrving A Lugo, MD   21 mg at 11/19/15 0750  . thiamine (VITAMIN B-1) tablet 100 mg  100 mg Oral Daily Earney NavyJosephine C Onuoha, NP   100 mg at 11/20/15 0819  . traMADol (ULTRAM) tablet 50 mg  50 mg Oral QHS Rachael FeeIrving A Lugo, MD   50 mg at 11/19/15 2115  . traZODone (DESYREL) tablet 200 mg  200 mg Oral QHS Cleotis NipperSyed T Denisse Whitenack, MD   200 mg at  11/19/15 2116    Lab Results: No results found for this or any previous visit (from the past 48 hour(s)).  Physical Findings: AIMS: Facial and Oral Movements Muscles of Facial Expression: None, normal Lips and Perioral Area: None, normal Jaw: None, normal Tongue: None, normal,Extremity Movements Upper (arms, wrists, hands, fingers): None, normal Lower (legs, knees, ankles, toes): None, normal, Trunk Movements Neck, shoulders, hips: None, normal, Overall Severity Severity of abnormal movements (highest score from questions above): None, normal Incapacitation due to abnormal movements: None, normal Patient's awareness of abnormal movements (rate only patient's report): No Awareness, Dental Status Current problems with teeth and/or dentures?: Yes Does patient usually wear dentures?: No  CIWA:  CIWA-Ar Total: 1 COWS:  COWS Total Score: 2  Musculoskeletal: Strength & Muscle Tone: within normal limits Gait & Station: normal Patient leans: N/A  Psychiatric Specialty Exam: Review of Systems  Neurological: Positive for tremors.  Psychiatric/Behavioral: Positive for depression and substance abuse. Negative for suicidal ideas and hallucinations. The patient is nervous/anxious and has insomnia.   All other systems reviewed and are negative.   Blood pressure 126/72, pulse 85, temperature 97.5 F (36.4 C), temperature source Oral, resp. rate 16, height 5\' 7"  (1.702 m), weight 58.968 kg (130 lb), SpO2 97 %.Body mass index is 20.36 kg/(m^2).   General Appearance: Fairly groomed  Patent attorneyye Contact:: Fair  Speech: Clear and Coherent  Volume: Decreased  Mood: Anxious, depressed  Affect: anxious worried  Thought Process: Circumstantial   Orientation: Full (Time, Place, and Person)  Thought Content: Rumination  Suicidal Thoughts: Yes, passive and fleeting thoughts but no plan   Homicidal Thoughts: No  Memory: Immediate; Fair Recent; Fair Remote; Fair  Judgement: Fair   Insight: Present and Shallow  Psychomotor Activity: Restlessness  Concentration: Fair  Recall: Poor  Fund of Knowledge:Fair  Language: Fair  Akathisia: No  Handed: Right  AIMS (if indicated):    Assets: Desire for Improvement  ADL's: Intact  Cognition: WNL  Sleep: Number of Hours: 5         Treatment Plan Summary: -Daily contact with patient to assess and evaluate symptoms and progress in treatment,  -Continue trazodone 200 mg at bedtime, -Will add low-dose Risperdal at bedtime to help negative thinking and insomnia, -Continue Ativan detox protocol, -Continue Prozac 20 mg daily, -Continue Tramadol 50mg  qhs for chronic pain at night -Continue to encourage deposited in group milieu therapy and use Vistaril 25 mg as needed for severe anxiety and nervousness.  Jameal Razzano T., MD 11/20/2015,

## 2015-11-20 NOTE — Progress Notes (Signed)
Psychoeducational Group Note  Date:  11/20/2015 Time:  2045 Group Topic/Focus:  wrap up group  Participation Level: Did Not Attend  Participation Quality:  Not Applicable  Affect:  Not Applicable  Cognitive:  Not Applicable  Insight:  Not Applicable  Engagement in Group: Not Applicable  Additional Comments:  Pt was notified that group was beginning but remained in her room.   Shelah LewandowskySquires, Bita Cartwright Carol 11/20/2015, 9:02 PM

## 2015-11-21 LAB — TSH: TSH: 2.597 u[IU]/mL (ref 0.350–4.500)

## 2015-11-21 MED ORDER — IBUPROFEN 600 MG PO TABS
600.0000 mg | ORAL_TABLET | Freq: Once | ORAL | Status: AC
  Administered 2015-11-21: 600 mg via ORAL
  Filled 2015-11-21 (×2): qty 1

## 2015-11-21 MED ORDER — IBUPROFEN 400 MG PO TABS
400.0000 mg | ORAL_TABLET | Freq: Three times a day (TID) | ORAL | Status: DC | PRN
  Administered 2015-11-21 – 2015-11-22 (×3): 400 mg via ORAL
  Filled 2015-11-21 (×4): qty 1

## 2015-11-21 MED ORDER — RISPERIDONE 0.5 MG PO TABS
0.5000 mg | ORAL_TABLET | Freq: Two times a day (BID) | ORAL | Status: DC
  Administered 2015-11-21 – 2015-11-23 (×4): 0.5 mg via ORAL
  Filled 2015-11-21 (×11): qty 1

## 2015-11-21 MED ORDER — FLUOXETINE HCL 20 MG PO CAPS
40.0000 mg | ORAL_CAPSULE | Freq: Every day | ORAL | Status: DC
  Administered 2015-11-22 – 2015-11-23 (×2): 40 mg via ORAL
  Filled 2015-11-21 (×5): qty 2

## 2015-11-21 NOTE — [facility] Group Notes (Signed)
[facility] LCSW Aftercare Discharge Planning Group Note  11/21/2015  8:45 AM  Participation Quality: Did Not Attend. Patient invited to participate but declined.  Jospeh Mangel, MSW, LCSWA Clinical Social Worker [facility] [phone]   

## 2015-11-21 NOTE — [facility] Group Notes (Signed)
Patient did not attend group.  Inocente Krach, MHT 

## 2015-11-21 NOTE — Progress Notes (Signed)
CSW spoke with patient's social worker from [facility] [phone](Tanya [phone]) to update on discharge plans and treatment recommendations.   Patient provided with listing of local pharmacies that deliver medications at MD's recommendations.  Samuella BruinKristin Anayelli Lai, MSW, [employer] Clinical Social Worker [facility] [phone]
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
## 2015-11-21 NOTE — Progress Notes (Signed)
Admit Note :  Nursing Progress Note: 7-7p  D- Mood is depressed and anxious. Affect is blunted and appropriate. Pt is able to contract for safety. Continues to have difficulty staying asleep, due to tooth pain. Reports getting some relieve from the motrin.  A -  Pt is withdrawn and isolative, c/o toothache, encourage to rinse area with warm water.Support and encouragement offered, safety maintained with q 15 minutes.  R-Contracts for safety and continues to follow treatment plan, working on learning new coping skills.

## 2015-11-21 NOTE — [facility] Group Notes (Signed)
[facility] LCSW Group Therapy 11/21/2015  1:15 PM   Type of Therapy: Group Therapy  Participation Level: Did Not Attend. Patient invited to participate but declined.   Dwan Fennel, MSW, LCSWA Clinical Social Worker [facility] [phone]   

## 2015-11-21 NOTE — Progress Notes (Signed)
D.  Pt pleasant on approach, but reports that she is in great pain with her tooth.  Pt would like a dental referral.  Pt did not feel able to attend evening AA group, interacting appropriately with peers on the unit.  Denies SI/HI/hallucinations at this time.  A.  Support and encouragement offered, medication given as ordered for pain.  R.  Pt remains safe on the unit, will continue to monitor.

## 2015-11-21 NOTE — Progress Notes (Addendum)
[facility] MD Progress Note  11/21/2015  Amanda Huff  MRN:  [id number]   Subjective:  I'm not feeling good.  I'm getting more depressed and he continues to have suicidal thoughts.    Objective: Pt seen and chart reviewed.  Patient remained very depressed sad and tearful.  She is easily tearful.  She is complaining of toothache and she is taking Tylenol which is not helpful.  She is very emotional and continued to endorse suicidal thoughts.  She endorse feeling isolated, withdrawn and hopelessness.  She is very concerned about her living situation.  Yesterday she had a panic attack.  We started Risperdal and she took 0.5 at bedtime.  She felt few hours of good sleep but then again she has racing thoughts and she felt very overwhelmed.  She has lot of negative thinking.  She has difficulty in attention concentration and she feels hopeless.  She is tolerating her medication and denies any side effects.  She's not going to the groups.  She does not participate.  She continues to have negative thinking.  Principal Problem: Alcohol use disorder, severe, dependence (HCC), major depressive disorder, recurrent Diagnosis:   Patient Active Problem List   Diagnosis Date Noted  . Alcohol use disorder, severe, dependence (HCC) [F10.20] 11/11/2015  . 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 Depression, major, recurrent, moderate (HCC) [F33.1] 10/29/2015   Total Time spent with patient: 15 minutes  Past Psychiatric History:  See above  Past Medical History:  Past Medical History  Diagnosis Date  . ETOH abuse     Past Surgical History  Procedure Laterality Date  . Cesarean section     Family History: History reviewed. No pertinent family history. Family Psychiatric  History:  None  Social History:  History  Alcohol Use  . Yes    Comment: occasionally     History  Drug Use No    Social History   Social History  . Marital Status: Single    Spouse Name: N/A   . Number of Children: N/A  . Years of Education: N/A   Social History Main Topics  . Smoking status: Current Every Day Smoker -- 0.50 packs/day    Types: Cigarettes  . Smokeless tobacco: None  . Alcohol Use: Yes     Comment: occasionally  . Drug Use: No  . Sexual Activity: No   Other Topics Concern  . None   Social History Narrative   Additional Social History:   Sleep: Fair  Appetite:  Fair  Current Medications: Current Facility-Administered Medications  Medication Dose Route Frequency Provider Last Rate Last Dose  . acetaminophen (TYLENOL) tablet 650 mg  650 mg Oral Q6H PRN Earney Navy, NP   650 mg at 11/20/15 0824  . alum & mag hydroxide-simeth (MAALOX/MYLANTA) 200-200-20 MG/5ML suspension 30 mL  30 mL Oral Q4H PRN Earney Navy, NP      . aspirin-acetaminophen-caffeine (EXCEDRIN MIGRAINE) per tablet 2 tablet  2 tablet Oral Q6H PRN Rachael Fee, MD   2 tablet at 11/20/15 1958  . benzocaine (ORAJEL) 10 % mucosal gel   Mouth/Throat QID PRN Rachael Fee, MD      . Melene Muller ON 11/22/2015] FLUoxetine (PROZAC) capsule 40 mg  40 mg Oral Daily Cleotis Nipper, MD      . hydrOXYzine (ATARAX/VISTARIL) tablet 25 mg  25 mg Oral Q6H PRN Cleotis Nipper, MD      . magnesium hydroxide (MILK OF MAGNESIA) suspension 30  mL  30 mL Oral Daily PRN Earney NavyJosephine C Onuoha, NP   30 mL at 11/16/15 1646  . multivitamin with minerals tablet 1 tablet  1 tablet Oral Daily Beau FannyJohn C Withrow, FNP   1 tablet at 11/21/15 [id number]  . nicotine (NICODERM CQ - dosed in mg/24 hours) patch 21 mg  21 mg Transdermal Daily Rachael FeeIrving A Lugo, MD   21 mg at 11/19/15 0750  . risperiDONE (RISPERDAL) tablet 0.5 mg  0.5 mg Oral BID Cleotis NipperSyed T Arfeen, MD      . thiamine (VITAMIN B-1) tablet 100 mg  100 mg Oral Daily Earney NavyJosephine C Onuoha, NP   100 mg at 11/21/15 [id number]  . traMADol (ULTRAM) tablet 50 mg  50 mg Oral QHS Rachael FeeIrving A Lugo, MD   50 mg at 11/20/15 2115  . traZODone (DESYREL) tablet 200 mg  200 mg Oral QHS Cleotis NipperSyed T Arfeen, MD   200  mg at 11/20/15 2207    Lab Results: No results found for this or any previous visit (from the past 48 hour(s)).  Physical Findings: AIMS: Facial and Oral Movements Muscles of Facial Expression: None, normal Lips and Perioral Area: None, normal Jaw: None, normal Tongue: None, normal,Extremity Movements Upper (arms, wrists, hands, fingers): None, normal Lower (legs, knees, ankles, toes): None, normal, Trunk Movements Neck, shoulders, hips: None, normal, Overall Severity Severity of abnormal movements (highest score from questions above): None, normal Incapacitation due to abnormal movements: None, normal Patient's awareness of abnormal movements (rate only patient's report): No Awareness, Dental Status Current problems with teeth and/or dentures?: Yes Does patient usually wear dentures?: No  CIWA:  CIWA-Ar Total: 2 COWS:  COWS Total Score: 2  Musculoskeletal: Strength & Muscle Tone: within normal limits Gait & Station: normal Patient leans: N/A  Psychiatric Specialty Exam: Review of Systems  HENT:       Dental pain  Neurological: Positive for tremors and headaches.  Psychiatric/Behavioral: Positive for depression and substance abuse. Negative for suicidal ideas and hallucinations. The patient is nervous/anxious and has insomnia.   All other systems reviewed and are negative.   Blood pressure 103/65, pulse 95, temperature 98.4 F (36.9 C), temperature source Oral, resp. rate 16, height 5\' 7"  (1.702 m), weight 58.968 kg (130 lb), SpO2 97 %.Body mass index is 20.36 kg/(m^2).   General Appearance: Fairly groomed  Patent attorneyye Contact:: Fair  Speech: Clear and Coherent  Volume: Decreased  Mood: Anxious, depressed  Affect: anxious worried  Thought Process: Circumstantial   Orientation: Full (Time, Place, and Person)  Thought Content: Rumination  Suicidal Thoughts: Yes, passive and fleeting thoughts but no plan   Homicidal Thoughts: No  Memory: Immediate;  Fair Recent; Fair Remote; Fair  Judgement: Fair  Insight: Present and Shallow  Psychomotor Activity: Restlessness  Concentration: Fair  Recall: Poor  Fund of Knowledge:Fair  Language: Fair  Akathisia: No  Handed: Right  AIMS (if indicated):    Assets: Desire for Improvement  ADL's: Intact  Cognition: WNL  Sleep: Number of Hours: 5         Treatment Plan Summary: -Daily contact with patient to assess and evaluate symptoms and progress in treatment,  -Increase Risperdal 0.5 mg twice a day ,  -Continue trazodone 200 mg at bedtime, -Continue Ativan detox protocol, -Increase Prozac 40 mg daily , discuss in detail medication side effects and benefits.  At this time patient does not have any tremors, shakes or any EPS.   -Continue Tramadol 50mg  qhs for chronic pain at night, Motrin 400 mg  3 times a day for dental pain -Continue to encourage deposited in group milieu therapy and use Vistaril 25 mg as needed for severe anxiety and nervousness. -Will order hemoglobin A1c and TSH.  ARFEEN,SYED T., MD 11/21/2015,

## 2015-11-22 DIAGNOSIS — F332 Major depressive disorder, recurrent severe without psychotic features: Secondary | ICD-10-CM

## 2015-11-22 LAB — HEMOGLOBIN A1C
HEMOGLOBIN A1C: 5.4 % (ref 4.8–5.6)
MEAN PLASMA GLUCOSE: 108 mg/dL

## 2015-11-22 MED ORDER — MIRTAZAPINE 7.5 MG PO TABS
7.5000 mg | ORAL_TABLET | Freq: Every day | ORAL | Status: DC
  Administered 2015-11-22 – 2015-11-23 (×2): 7.5 mg via ORAL
  Filled 2015-11-22 (×6): qty 1

## 2015-11-22 MED ORDER — AMOXICILLIN 500 MG PO CAPS
500.0000 mg | ORAL_CAPSULE | Freq: Two times a day (BID) | ORAL | Status: DC
  Administered 2015-11-22 – 2015-11-24 (×5): 500 mg via ORAL
  Filled 2015-11-22 (×6): qty 1
  Filled 2015-11-22: qty 2
  Filled 2015-11-22 (×3): qty 1

## 2015-11-22 MED ORDER — IBUPROFEN 600 MG PO TABS
600.0000 mg | ORAL_TABLET | Freq: Four times a day (QID) | ORAL | Status: DC | PRN
  Administered 2015-11-22 – 2015-11-24 (×6): 600 mg via ORAL
  Filled 2015-11-22 (×6): qty 1

## 2015-11-22 NOTE — Progress Notes (Signed)
DAR Note: Amanda Huff has been in her room much of the day.  She denies any SI/HI or A/V hallucinations.  She C/O tooth pain and has had ibuprofen X 2 with good relief.  Antibiotic started per MD order.  Amanda Huff reports that she has been on antibiotic in the past for this tooth pain and it helped.  She didn't attend groups but states that she just doesn't feel well.  She didn't complete her self inventory after encouragement.  Encouraged participation in group and unit activities.  Q 15 minute checks maintained for safety.  We will continue to monitor the progress towards her goals.

## 2015-11-22 NOTE — Progress Notes (Signed)
D.  Pt pleasant on approach, but pained by tooth.  Pt is hopeful to receive dental referral and was started on antibiotics today.  Pt positive for evening wrap up group, minimal interaction on unit due to tooth pain.  Denies SI/HI/hallucinations at this time.  A.  Support and encouragement offered, medication given as ordered for pain.  R.  Pt remains safe on unit, will continue to monitor.

## 2015-11-22 NOTE — Progress Notes (Signed)
[facility]  [facility]  Rolley SimsLisa Bakken  MRN:  [id number]   Subjective: Patient reports I'm not feeling good its christmas eve and " my tooth hurts 10/10"  Objective: Rolley SimsLisa Austill is awake, alert and oriented X4 , found standing at nursing station.  Denies suicidal or homicidal ideation. Denies auditory or visual hallucination and does not appear to be responding to internal stimuli. patient reports tooth pain 10/10 that started 3 days ago. Patient interacts well with staff and others. Patient reports she is medication compliant without mediation side effects. States her depression 7/10. Patient states "I'm not feeling not feeling good its christmas eve and my tooth hurts today" .Reports good appetite other is unable to resting well during the night. Support, encouragement and reassurance was provided.   Principal Problem: Alcohol use disorder, severe, dependence (HCC), major depressive disorder, recurrent Diagnosis:   Patient Active Problem List   Diagnosis Date Noted  . Alcohol use disorder, severe, dependence (HCC) [F10.20] 11/11/2015  . 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 Depression, major, recurrent, moderate (HCC) [F33.1] 10/29/2015   Total Time spent with patient: 15 minutes  Past Psychiatric History:  Depression, EtoH abuse  Past Medical History:  Past Medical History  Diagnosis Date  . ETOH abuse     Past Surgical History  Procedure Laterality Date  . Cesarean section     Family History: History reviewed. No pertinent family history. Family Psychiatric  History:  None  Social History:  History  Alcohol Use  . Yes    Comment: occasionally     History  Drug Use No    Social History   Social History  . Marital Status: Single    Spouse Name: N/A  . Number of Children: N/A  . Years of Education: N/A   Social History Main Topics  . Smoking status: Current Every Day Smoker -- 0.50 packs/day    Types: Cigarettes  .  Smokeless tobacco: None  . Alcohol Use: Yes     Comment: occasionally  . Drug Use: No  . Sexual Activity: No   Other Topics Concern  . None   Social History Narrative   Additional Social History:   Sleep: Fair  Appetite:  Fair  Current Medications: Current Facility-Administered Medications  Medication Dose Route Frequency Provider Last Rate Last Dose  . acetaminophen (TYLENOL) tablet 650 mg  650 mg Oral Q6H PRN Earney NavyJosephine C Onuoha, NP   650 mg at 11/20/15 0824  . alum & mag hydroxide-simeth (MAALOX/MYLANTA) 200-200-20 MG/5ML suspension 30 mL  30 mL Oral Q4H PRN Earney NavyJosephine C Onuoha, NP      . amoxicillin (AMOXIL) capsule 500 mg  500 mg Oral Q12H Saramma Eappen, MD   500 mg at 11/22/15 1026  . aspirin-acetaminophen-caffeine (EXCEDRIN MIGRAINE) per tablet 2 tablet  2 tablet Oral Q6H PRN Rachael FeeIrving A Lugo, MD   2 tablet at 11/21/15 1708  . benzocaine (ORAJEL) 10 % mucosal gel   Mouth/Throat QID PRN Rachael FeeIrving A Lugo, MD      . FLUoxetine (PROZAC) capsule 40 mg  40 mg Oral Daily Cleotis NipperSyed T Arfeen, MD   40 mg at 11/22/15 [id number]  . hydrOXYzine (ATARAX/VISTARIL) tablet 25 mg  25 mg Oral Q6H PRN Cleotis NipperSyed T Arfeen, MD   25 mg at 11/21/15 2114  . ibuprofen (ADVIL,MOTRIN) tablet 600 mg  600 mg Oral Q6H PRN Jomarie LongsSaramma Eappen, MD      . magnesium hydroxide (MILK OF MAGNESIA) suspension 30 mL  30  mL Oral Daily PRN Earney Navy, NP   30 mL at 11/16/15 1646  . multivitamin with minerals tablet 1 tablet  1 tablet Oral Daily Beau Fanny, FNP   1 tablet at 11/22/15 0751  . nicotine (NICODERM CQ - dosed in mg/24 hours) patch 21 mg  21 mg Transdermal Daily Rachael Fee, MD   21 mg at 11/19/15 0750  . risperiDONE (RISPERDAL) tablet 0.5 mg  0.5 mg Oral BID Cleotis Nipper, MD   0.5 mg at 11/22/15 0752  . thiamine (VITAMIN B-1) tablet 100 mg  100 mg Oral Daily Earney Navy, NP   100 mg at 11/22/15 0751  . traMADol (ULTRAM) tablet 50 mg  50 mg Oral QHS Rachael Fee, MD   50 mg at 11/21/15 2114  . traZODone (DESYREL)  tablet 200 mg  200 mg Oral QHS Cleotis Nipper, MD   200 mg at 11/21/15 2114    Lab Results:  Results for orders placed or performed during the hospital encounter of 11/11/15 (from the past 48 hour(s))  Hemoglobin A1c     Status: None   Collection Time: 11/21/15  6:13 PM  Result Value Ref Range   Hgb A1c MFr Bld 5.4 4.8 - 5.6 %    Comment: (NOTE)         Pre-diabetes: 5.7 - 6.4         Diabetes: >6.4         Glycemic control for adults with diabetes: <7.0    Mean Plasma Glucose 108 mg/dL    Comment: (NOTE) Performed At: [facility] [address] [place], [place] [id number] Mila Homer MD [id number] Performed at [facility]   TSH     Status: None   Collection Time: 11/21/15  6:13 PM  Result Value Ref Range   TSH 2.597 0.350 - 4.500 uIU/mL    Comment: Performed at [facility]    Physical Findings: AIMS: Facial and Oral Movements Muscles of Facial Expression: None, normal Lips and Perioral Area: None, normal Jaw: None, normal Tongue: None, normal,Extremity Movements Upper (arms, wrists, hands, fingers): None, normal Lower (legs, knees, ankles, toes): None, normal, Trunk Movements Neck, shoulders, hips: None, normal, Overall Severity Severity of abnormal movements (highest score from questions above): None, normal Incapacitation due to abnormal movements: None, normal Patient's awareness of abnormal movements (rate only patient's report): No Awareness, Dental Status Current problems with teeth and/or dentures?: Yes Does patient usually wear dentures?: No  CIWA:  CIWA-Ar Total: 1 COWS:  COWS Total Score: 2  Musculoskeletal: Strength & Muscle Tone: within normal limits Gait & Station: normal Patient leans: N/A  Psychiatric Specialty Exam: Review of Systems  HENT:       Dental pain  Psychiatric/Behavioral: Positive for depression and substance abuse. Negative for suicidal ideas and hallucinations. The patient is  nervous/anxious and has insomnia.   All other systems reviewed and are negative.   Blood pressure 134/77, pulse 88, temperature 98.4 F (36.9 C), temperature source Oral, resp. rate 20, height  (1.702 m), weight 58.968 kg (130 lb), SpO2 97 %.Body mass index is 20.36 kg/(m^2).   General Appearance: Fairly groomed, causal and pleasant   Eye Contact:: Fair  Speech: Clear and Coherent  Volume: normal   Mood: Anxious, depressed  Affect: anxious worried  Thought Process: Circumstantial   Orientation: Full (Time, Place, and Person)  Thought Content: Rumination  Suicidal Thoughts: Yes, passive and fleeting thoughts but no  plan   Homicidal Thoughts: No  Memory: Immediate; Fair Recent; Fair Remote; Fair  Judgement: Fair  Insight: Present and Shallow  Psychomotor Activity: Restlessness  Concentration: Fair  Recall: Poor  Fund of Knowledge:Fair  Language: Fair  Akathisia: No  Handed: Right  AIMS (if indicated):    Assets: Desire for Improvement  ADL's: Intact  Cognition: WNL  Sleep: Number of Hours: 5           I agree with current treatment plan on 11/22/2015, Patient seen face-to-face for psychiatric evaluation follow-up, chart reviewed.  Reviewed the information documented and agree with the treatment plan.  Treatment Plan Summary: -Daily contact with patient to assess and evaluate symptoms and progress in treatment,  -Increase Risperdal 0.5 mg twice a day ,  -discontinue trazodone 200 mg at bedtime for insomnia, Start Remeron 7.5mg s  PO QHS for insomnia -Continue Ativan detox protocol, -Start Amoxicillin 500 mg PO BID X 5 day for possible tooth pain/infection -Continue Prozac 40 mg daily , discuss in detail medication side effects and benefits.  At this time patient does not have any tremors, shakes or any EPS.   -Continue Tramadol  qhs for chronic pain at night, Motrin 400 mg 3 times a day for dental pain -Continue to  encourage deposited in group milieu therapy and use Vistaril 25 mg as needed for severe anxiety and nervousness. -Will order hemoglobin A1c and TSH.  Oneta Rack, FNP-BC 11/22/2015,

## 2015-11-22 NOTE — [facility] Group Notes (Signed)
Adult Psychoeducational Group Note  Date:  11/22/2015 Time:  9:10 PM  Group Topic/Focus:  Wrap-Up Group:   The focus of this group is to help patients review their daily goal of treatment and discuss progress on daily workbooks.  Participation Level:  Minimal  Participation Quality:  Attentive  Affect:  Flat  Cognitive:  Appropriate  Insight: Limited  Engagement in Group:  None  Modes of Intervention:  Discussion  Additional Comments:  Pt stated that she wasn't paying attention to the movie that the pt were watching during group.  Amanda Huff, Amanda Huff 11/22/2015, 9:10 PM

## 2015-11-22 NOTE — [facility] Group Notes (Signed)
[facility] Group Notes: (Clinical Social Work)   11/22/2015      Type of Therapy:  Group Therapy   Participation Level:  Did Not Attend despite MHT prompting   Hershell Brandl Grossman-Orr, LCSW 11/22/2015, 12:23 PM     

## 2015-11-23 NOTE — Progress Notes (Signed)
[facility] MD Progress Note  11/23/2015  Dynisha Due  MRN:  [id number]   Subjective: Patient reports "I 'm not happy, I ready to go and tomorrow will be my last day here."  Objective: Amanda Huff is awake, alert and oriented X4 , found standing at nursing station.  Denies suicidal or homicidal ideation. Denies auditory or visual hallucination and does not appear to be responding to internal stimuli. patient reports tooth pain 8/10 that started 4 days ago. Patient states she is refusing all mood stabilizations medications. Reports that the medication are not helping and is making her think slowly.Patient reports interacting well with staff and others. Patient reports she is medication compliant without mediation side effects. States her depression 0/10. States that is frustration and not depression. Patient states "I'm ready to be discharge. I am aware that I have a drinking problem but tomorrow will be my last day here." States that she has all the resources to be successful on the outside. Reports good appetite other is unable to resting well during the night. Support, encouragement and reassurance was provided.   Principal Problem: Alcohol use disorder, severe, dependence (HCC), major depressive disorder, recurrent Diagnosis:   Patient Active Problem List   Diagnosis Date Noted  . Alcohol use disorder, severe, dependence (HCC) [F10.20] 11/11/2015  . 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 Depression, major, recurrent, moderate (HCC) [F33.1] 10/29/2015   Total Time spent with patient: 15 minutes  Past Psychiatric History:  Depression, EtoH abuse  Past Medical History:  Past Medical History  Diagnosis Date  . ETOH abuse     Past Surgical History  Procedure Laterality Date  . Cesarean section     Family History: History reviewed. No pertinent family history. Family Psychiatric  History:  None  Social History:  History  Alcohol Use  . Yes     Comment: occasionally     History  Drug Use No    Social History   Social History  . Marital Status: Single    Spouse Name: N/A  . Number of Children: N/A  . Years of Education: N/A   Social History Main Topics  . Smoking status: Current Every Day Smoker -- 0.50 packs/day    Types: Cigarettes  . Smokeless tobacco: None  . Alcohol Use: Yes     Comment: occasionally  . Drug Use: No  . Sexual Activity: No   Other Topics Concern  . None   Social History Narrative   Additional Social History:   Sleep: Fair  Appetite:  Fair  Current Medications: Current Facility-Administered Medications  Medication Dose Route Frequency Provider Last Rate Last Dose  . acetaminophen (TYLENOL) tablet 650 mg  650 mg Oral Q6H PRN Earney Navy, NP   650 mg at 11/20/15 0824  . alum & mag hydroxide-simeth (MAALOX/MYLANTA) 200-200-20 MG/5ML suspension 30 mL  30 mL Oral Q4H PRN Earney Navy, NP      . amoxicillin (AMOXIL) capsule 500 mg  500 mg Oral Q12H Oneta Rack, NP   500 mg at 11/23/15 0749  . aspirin-acetaminophen-caffeine (EXCEDRIN MIGRAINE) per tablet 2 tablet  2 tablet Oral Q6H PRN Rachael Fee, MD   2 tablet at 11/23/15 1111  . benzocaine (ORAJEL) 10 % mucosal gel   Mouth/Throat QID PRN Rachael Fee, MD      . FLUoxetine (PROZAC) capsule 40 mg  40 mg Oral Daily Cleotis Nipper, MD   40 mg at 11/23/15 0750  .  hydrOXYzine (ATARAX/VISTARIL) tablet 25 mg  25 mg Oral Q6H PRN Cleotis NipperSyed T Arfeen, MD   25 mg at 11/22/15 2110  . ibuprofen (ADVIL,MOTRIN) tablet 600 mg  600 mg Oral Q6H PRN Jomarie LongsSaramma Eappen, MD   600 mg at 11/23/15 0130  . magnesium hydroxide (MILK OF MAGNESIA) suspension 30 mL  30 mL Oral Daily PRN Earney NavyJosephine C Onuoha, NP   30 mL at 11/16/15 1646  . mirtazapine (REMERON) tablet 7.5 mg  7.5 mg Oral QHS Oneta Rackanika N Jyra Lagares, NP   7.5 mg at 11/22/15 2110  . multivitamin with minerals tablet 1 tablet  1 tablet Oral Daily Beau FannyJohn C Withrow, FNP   1 tablet at 11/23/15 0749  . nicotine (NICODERM  CQ - dosed in mg/24 hours) patch 21 mg  21 mg Transdermal Daily Rachael FeeIrving A Lugo, MD   21 mg at 11/19/15 0750  . risperiDONE (RISPERDAL) tablet 0.5 mg  0.5 mg Oral BID Cleotis NipperSyed T Arfeen, MD   0.5 mg at 11/23/15 0750  . thiamine (VITAMIN B-1) tablet 100 mg  100 mg Oral Daily Earney NavyJosephine C Onuoha, NP   100 mg at 11/23/15 0749  . traMADol (ULTRAM) tablet 50 mg  50 mg Oral QHS Rachael FeeIrving A Lugo, MD   50 mg at 11/22/15 2111    Lab Results:  Results for orders placed or performed during the hospital encounter of 11/11/15 (from the past 48 hour(s))  Hemoglobin A1c     Status: None   Collection Time: 11/21/15  6:13 PM  Result Value Ref Range   Hgb A1c MFr Bld 5.4 4.8 - 5.6 %    Comment: (NOTE)         Pre-diabetes: 5.7 - 6.4         Diabetes: >6.4         Glycemic control for adults with diabetes: <7.0    Mean Plasma Glucose 108 mg/dL    Comment: (NOTE) Performed At: [facility] [address] [place] [id number] Mila HomerHancock William F MD WU:[phone] Performed at [facility]   TSH     Status: None   Collection Time: 11/21/15  6:13 PM  Result Value Ref Range   TSH 2.597 0.350 - 4.500 uIU/mL    Comment: Performed at [facility]    Physical Findings: AIMS: Facial and Oral Movements Muscles of Facial Expression: None, normal Lips and Perioral Area: None, normal Jaw: None, normal Tongue: None, normal,Extremity Movements Upper (arms, wrists, hands, fingers): None, normal Lower (legs, knees, ankles, toes): None, normal, Trunk Movements Neck, shoulders, hips: None, normal, Overall Severity Severity of abnormal movements (highest score from questions above): None, normal Incapacitation due to abnormal movements: None, normal Patient's awareness of abnormal movements (rate only patient's report): No Awareness, Dental Status Current problems with teeth and/or dentures?: Yes Does patient usually wear dentures?: No  CIWA:  CIWA-Ar Total: 1 COWS:  COWS  Total Score: 2  Musculoskeletal: Strength & Muscle Tone: within normal limits Gait & Station: normal Patient leans: N/A  Psychiatric Specialty Exam: Review of Systems  HENT:       Dental pain  Psychiatric/Behavioral: Positive for depression and substance abuse. Negative for suicidal ideas and hallucinations. The patient is nervous/anxious and has insomnia.   All other systems reviewed and are negative.   Blood pressure 110/83, pulse 82, temperature 98 F (36.7 C), temperature source Oral, resp. rate 16, height 5\' 7"  (1.702 m), weight 58.968 kg (130 lb), SpO2 97 %.Body mass index is 20.36 kg/(m^2).  General Appearance:  Causal, pleasant and cooperative  Eye Contact:: Fair  Speech: Clear and Coherent  Volume: normal   Mood: Anxious, depressed  Affect: anxious worried and irritable   Thought Process: Circumstantial   Orientation: Full (Time, Place, and Person)  Thought Content: Rumination  Suicidal Thoughts: NO  Homicidal Thoughts: No  Memory: Immediate; Fair Recent; Fair Remote; Fair  Judgement: Fair  Insight: Present and Shallow  Psychomotor Activity: Restlessness  Concentration: Fair  Recall: Poor  Fund of Knowledge:Fair  Language: Fair  Akathisia: No  Handed: Right  AIMS (if indicated):    Assets: Desire for Improvement  ADL's: Intact  Cognition: WNL  Sleep: Number of Hours: 5           I agree with current treatment plan on 11/23/2015, Patient seen face-to-face for psychiatric evaluation follow-up, chart reviewed.  Reviewed the information documented and agree with the treatment plan.  Treatment Plan Summary: -Daily contact with patient to assess and evaluate symptoms and progress in treatment,  -Increase Risperdal 0.5 mg twice a day ,  -Discontinue trazodone 200 mg at bedtime for insomnia, Start Remeron 7.5mg s  PO QHS for insomnia -Continue Ativan detox protocol, - Continue Amoxicillin 500 mg PO BID X 5 day  for possible tooth pain/infection -Continue Prozac 40 mg daily , discuss in detail medication side effects and benefits.  At this time patient does not have any tremors, shakes or any EPS.   -Continue Tramadol  qhs for chronic pain at night, Motrin 400 mg 3 times a day for dental pain -Continue to encourage deposited in group milieu therapy and use Vistaril 25 mg as needed for severe anxiety and nervousness. -Consider patient for discharge on 11/24/2015 -Will order hemoglobin A1c and TSH.  Oneta Rack, FNP-BC 11/23/2015,

## 2015-11-23 NOTE — Progress Notes (Signed)
D: Patient has been up in the milieu.  She presents with sad, depressed mood.  She is pleasant upon approach.  She rates her depression and hopelessness as an 8; anxiety as a 9.  She report poor sleep and low energy level.  She denies any withdrawal symptoms.  She states that her tooth pain has improved.  She denies SI/HI/AVH.   A: Continue to monitor medication management and MD orders.  Safety checks completed every 15 minutes per protocol.  Offer support and encouragement as needed. R: Patient is receptive to staff; her behavior is appropriate.

## 2015-11-24 MED ORDER — FLUOXETINE HCL 40 MG PO CAPS: 40.0000 mg | ORAL_CAPSULE | Freq: Every day | ORAL | Status: DC

## 2015-11-24 MED ORDER — MIRTAZAPINE 7.5 MG PO TABS: 7.5000 mg | ORAL_TABLET | Freq: Every day | ORAL | Status: DC

## 2015-11-24 MED ORDER — RISPERIDONE 0.5 MG PO TABS: 0.5000 mg | ORAL_TABLET | Freq: Two times a day (BID) | ORAL | Status: DC

## 2015-11-24 MED ORDER — NICOTINE 21 MG/24HR TD PT24: 21.0000 mg | MEDICATED_PATCH | Freq: Every day | TRANSDERMAL | Status: DC

## 2015-11-24 MED ORDER — HYDROXYZINE HCL 25 MG PO TABS: 25.0000 mg | ORAL_TABLET | Freq: Four times a day (QID) | ORAL | Status: DC | PRN

## 2015-11-24 MED ORDER — AMOXICILLIN 500 MG PO CAPS: 500.0000 mg | ORAL_CAPSULE | Freq: Two times a day (BID) | ORAL | Status: DC

## 2015-11-24 NOTE — Discharge Summary (Signed)
Physician Discharge Summary Note  Patient:  Amanda Huff is an 54 y.o., female MRN:  [id number] DOB:  31-Dec-1960 Patient phone:  [phone] (home)  Patient address:   [address] [place] [address],  Total Time spent with patient: Greater than 30 minutes  Date of Admission:  11/11/2015  Date of Discharge: 11/24/2015  Reason for Admission:  Alcohol abuse with alcohol-induced mood disorder (HCC)  History of Present Illness: Alcohol use disorder, severe, dependence   Principal Problem: Alcohol use disorder, severe, dependence [facility])  Discharge Diagnoses: Patient Active Problem List   Diagnosis Date Noted  . Alcohol use disorder, severe, dependence (HCC) [F10.20] 11/11/2015  . 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 Depression, major, recurrent, moderate (HCC) [F33.1] 10/29/2015   Past Psychiatric History: Alcohol dependence  Past Medical History:  Past Medical History  Diagnosis Date  . ETOH abuse     Past Surgical History  Procedure Laterality Date  . Cesarean section     Family History: History reviewed. No pertinent family history.  Family Psychiatric  History: See H&P  Social History:  History  Alcohol Use  . Yes    Comment: occasionally     History  Drug Use No    Social History   Social History  . Marital Status: Single    Spouse Name: N/A  . Number of Children: N/A  . Years of Education: N/A   Social History Main Topics  . Smoking status: Current Every Day Smoker -- 0.50 packs/day    Types: Cigarettes  . Smokeless tobacco: None  . Alcohol Use: Yes     Comment: occasionally  . Drug Use: No  . Sexual Activity: No   Other Topics Concern  . None   Social History Narrative   Hospital Course:  54 Y/O female who was D/C from this unit on December the 2nd. Amanda Huff back to [employer] went to her apartment. State she was probably sober a day or two. States she is alone. States she lost her car, she is  running out of money. She is drinking Vodka a big bottle five days a week. Has been increasingly more depressed. She is going to be responsible for rent in a month or so. States she is not going to be able to afford it so she is anticipating she is going to be homeless.   Amanda Huff was re-admitted to the hospital for alcohol detoxification treatment. Her blood alcohol level upon her re-admission was 408 per toxicology tests reports. She was intoxicated. Amanda Huff's lab reports also indicated elevated liver enzyme (AST), probably from chronic alcoholism. As a result, not a candidate for Librium detox protocols. This is because, Librium is a long acting Benzodiazepine with a long half-life. If used for this particular detox treatment will impose heavily on already compromised liver Enzymess. By using Amanda Huff received a cleaner detox treatment without the lingering adverse effects of the Librium capsules.  Besides the detox treatment, Amanda Huff also was medicated & discharged on; Fluoxetine 40 mg for depression, Hydroxyzine 25 mg for anxiety, Mirtazapine 7.5 mg for insomnia & Risperdal 0.5 mg for mood control. She also received other medication management (antibiotic therapy) for the other medical issues that she presented. She tolerated her treatment regimen without any significant adverse effects & or reactions reported. Amanda Huff participated in the AA/NA meetings & group counseling sessions being offered and held on this unit. She learned coping skills.  Amanda Huff has completed detox treatment. Her Mood is stable.  She currently is being discharged to her home to continue treatment on an outpatient basis as noted below. She has been given all the necessary information needed to make this appointment without problems. Upon discharge, she adamantly denies any SIHI, AVH, delusional thoughts, paranoia and or substance withdrawal symptoms. She received a 7 days worth, supply samples [facility]. [facility]  all personal belongings in no distress. Transportation per taxi. [facility] assisted with the taxi fare.  Consults:  psychiatry  Physical Findings:  AIMS: Facial and Oral Movements Muscles of Facial Expression: None, normal Lips and Perioral Area: None, normal Jaw: None, normal Tongue: None, normal,Extremity Movements Upper (arms, wrists, hands, fingers): None, normal Lower (legs, knees, ankles, toes): None, normal, Trunk Movements Neck, shoulders, hips: None, normal, Overall Severity Severity of abnormal movements (highest score from questions above): None, normal Incapacitation due to abnormal movements: None, normal Patient's awareness of abnormal movements (rate only patient's report): No Awareness, Dental Status Current problems with teeth and/or dentures?: Yes Does patient usually wear dentures?: No  CIWA:  CIWA-Ar Total: 1 COWS:  COWS Total Score: 2  Musculoskeletal: Strength & Muscle Tone: within normal limits Gait & Station: normal Patient leans: N/A  Psychiatric Specialty Exam: Review of Systems  Constitutional: Negative.   HENT: Negative.   Eyes: Negative.   Respiratory: Negative.   Cardiovascular: Negative.   Gastrointestinal: Negative.   Genitourinary: Negative.   Musculoskeletal: Negative.   Skin: Negative.   Neurological: Negative.   Endo/Heme/Allergies: Negative.   Psychiatric/Behavioral: Positive for depression (Stable) and substance abuse (Alcoholism, chronic). Negative for suicidal ideas, hallucinations and memory loss. The patient has insomnia (Stable). The patient is not nervous/anxious.   All other systems reviewed and are negative.   Blood pressure 164/88, pulse 78, temperature 98.1 F (36.7 C), temperature source Oral, resp. rate 18, height 5\' 7"  (1.702 m), weight 58.968 kg (130 lb), SpO2 97 %.Body mass index is 20.36 kg/(m^2).  See Md's SRA   Have you used any form of tobacco in the last 30 days? (Cigarettes, Smokeless Tobacco, Cigars, and/or Pipes):  Yes  Has this patient used any form of tobacco in the last 30 days? (Cigarettes, Smokeless Tobacco, Cigars, and/or Pipes):Yes, A prescription for an FDA-approved tobacco cessation medication was offered at discharge and the patient refused.   Metabolic Disorder Labs:  Lab Results  Component Value Date   HGBA1C 5.4 11/21/2015   MPG 108 11/21/2015   No results found for: PROLACTIN No results found for: CHOL, TRIG, HDL, CHOLHDL, VLDL, LDLCALC  See Psychiatric Specialty Exam and Suicide Risk Assessment completed by Attending Physician prior to discharge.  Discharge destination:  Home  Is patient on multiple antipsychotic therapies at discharge:  No   Has Patient had three or more failed trials of antipsychotic monotherapy by history:  No  Recommended Plan for Multiple Antipsychotic Therapies: NA    Medication List    STOP taking these medications        hydrOXYzine 25 MG capsule  Commonly known as:  VISTARIL     traZODone 50 MG tablet  Commonly known as:  DESYREL      TAKE these medications      Indication   amoxicillin 500 MG capsule  Commonly known as:  AMOXIL  Take 1 capsule (500 mg total) by mouth every 12 (twelve) hours. For infection   Indication:  Infection     FLUoxetine 40 MG capsule  Commonly known as:  PROZAC  Take 1 capsule (40  mg total) by mouth daily. For depression   Indication:  Depression     hydrOXYzine 25 MG tablet  Commonly known as:  ATARAX/VISTARIL  Take 1 tablet (25 mg total) by mouth every 6 (six) hours as needed for anxiety.   Indication:  Anxiety     mirtazapine 7.5 MG tablet  Commonly known as:  REMERON  Take 1 tablet (7.5 mg total) by mouth at bedtime. For insomnia   Indication:  Trouble Sleeping     nicotine 21 mg/24hr patch  Commonly known as:  NICODERM CQ - dosed in mg/24 hours  Place 1 patch (21 mg total) onto the skin daily. For smoking cessation   Indication:  Nicotine Addiction     risperiDONE 0.5 MG tablet  Commonly known  as:  RISPERDAL  Take 1 tablet (0.5 mg total) by mouth 2 (two) times daily. For mood control   Indication:  Mood control       Follow-up Information    Follow up with [facility]   Why:  Walk in between 8am-12pm Monday through Friday for hospital follow-up/assessment for mental health/substance abuse outpatient services.    Contact information:   [address] [place] [address] Phone: [phone] Fax: [phone]     Follow-up recommendations: Activity:  As tolerated Diet: As recommended by your primary care doctor. Keep all scheduled follow-up appointments as recommended.   Comments: Take all your medications as prescribed by your mental healthcare provider. Report any adverse effects and or reactions from your medicines to your outpatient provider promptly. Patient is instructed and cautioned to not engage in alcohol and or illegal drug use while on prescription medicines. In the event of worsening symptoms, patient is instructed to call the crisis hotline, 911 and or go to the nearest ED for appropriate evaluation and treatment of symptoms. Follow-up with your primary care provider for your other medical issues, concerns and or health care needs.    Signed: Sanjuana Kava, PMHNP, FNP-BC 11/24/2015, 1:06 PM  Patient seen, Suicide Assessment Completed.  Disposition Plan Reviewed
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
## 2015-11-24 NOTE — [facility] Suicide Risk Assessment (Addendum)
[facility] Discharge Suicide Risk Assessment   Demographic Factors:  54 year old , lives alone, currently  Unemployed.   Total Time spent with patient: 30 minutes  Musculoskeletal: Strength & Muscle Tone: within normal limits Gait & Station: normal Patient leans: N/A  Psychiatric Specialty Exam: Physical Exam  ROS  Blood pressure 164/88, pulse 78, temperature 98.1 F (36.7 C), temperature source Oral, resp. rate 18, height  (1.702 m), weight 130 lb (58.968 kg), SpO2 97 %.Body mass index is 20.36 kg/(m^2).  General Appearance: Well Groomed  Eye Contact::  Good  Speech:  Normal Rate409  Volume:  Normal  Mood:   Denies depression, states mood is "OK"   Affect:  Appropriate, anxious .   Thought Process:  Linear  Orientation:  Other:  fully alert and attentive   Thought Content:  no hallucinations, no delusions   Suicidal Thoughts:  No denies any suicidal ideations or any self injurious ideations   Homicidal Thoughts:  No denies any homicidal ideations  Memory:  recent and remote grossly intact   Judgement:  Other:  improved  Insight:  improved   Psychomotor Activity:  Normal  Concentration:  Good  Recall:  Good  Fund of Knowledge:Good  Language: Good  Akathisia:  Negative  Handed:  Right  AIMS (if indicated):     Assets:  Desire for Improvement Resilience  Sleep:  Number of Hours: 6.25  Cognition: WNL  ADL's:  Intact   Have you used any form of tobacco in the last 30 days? (Cigarettes, Smokeless Tobacco, Cigars, and/or Pipes): Yes  Has this patient used any form of tobacco in the last 30 days? (Cigarettes, Smokeless Tobacco, Cigars, and/or Pipes) Yes, A prescription for an FDA-approved tobacco cessation medication was offered at discharge and the patient refused  Mental Status Per Nursing Assessment::   On Admission:     Current Mental Status by Physician: At this time she presents fully alert and attentive, she reports improved mood and denies depression, she does  state she feels anxious , but attributes this partly to excitement about being discharge , no thought disorder, no SI, no HI, no hallucinations, no delusions No current symptoms of WDL, vitals stable, denies any cravings for alcohol at this time.  Loss Factors: Limited social support system, recently lost her car, resulting in more limited ability to mobilize places   Historical Factors: History of alcohol dependence, denies any history of suicide attempts   Risk Reduction Factors:   Sense of responsibility to family and Positive coping skills or problem solving skills  Continued Clinical Symptoms:  As noted, currently improved, denies depression, anxious, no thought disorder,  no SI or HI, no psychotic symptoms  Cognitive Features That Contribute To Risk:  No gross cognitive deficits noted upon discharge. Is alert , attentive, and oriented x 3   Suicide Risk:  Mild:  Suicidal ideation of limited frequency, intensity, duration, and specificity.  There are no identifiable plans, no associated intent, mild dysphoria and related symptoms, good self-control (both objective and subjective assessment), few other risk factors, and identifiable protective factors, including available and accessible social support.  Principal Problem: Alcohol use disorder, severe, dependence [facility]) Discharge Diagnoses:  Patient Active Problem List   Diagnosis Date Noted  . Alcohol use disorder, severe, dependence (HCC) [F10.20] 11/11/2015  . 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 Depression, major, recurrent, moderate (HCC) [F33.1] 10/29/2015    Follow-up Information    [facility] with [facility].   Why:  Walk in between 8am-12pm Monday through Friday for hospital follow-up/assessment for mental health/substance abuse outpatient services.    Contact information:   [address] [place], [place] [address] Phone: [phone] Fax: [phone]       Plan Of Care/Follow-up recommendations:  Activity:  as tolerated Diet:  Regular Tests:  NA Other:  See below   Is patient on multiple antipsychotic therapies at discharge:  No   Has Patient had three or more failed trials of antipsychotic monotherapy by history:  No  Recommended Plan for Multiple Antipsychotic Therapies: NA Patient is requesting discharge and there are no current grounds for involuntary commitment . Plans to return home. Patient encouraged to continue working on recovery , relapse prevention , and to go to 12 step meetings regularly   COBOS, FERNANDO 11/24/2015, 1:44 PM

## 2015-11-24 NOTE — Progress Notes (Signed)
D: Patient observed interacting with peers. Patient states her day was a 10 states she achieved her goal in that she had a good Merry Christmas.  A: Patient provided support and encouragement offered. Medications administered as ordered. Q 15 minute checks in progress and maintained. R: Patient receptive and safe on unit. Monitoring continues.

## 2015-11-24 NOTE — Progress Notes (Signed)
Patient  discharged home with samples and prescriptions. Patient was stable and appreciative at that time. All papers and prescriptions were given and valuables returned. Verbal understanding expressed. Denies SI/HI and A/VH. Patient given opportunity to express concerns and ask questions.  

## 2015-11-24 NOTE — Progress Notes (Signed)
  [facility]  Will you be returning to the same living situation after discharge:  Yes,  home At discharge, do you have transportation home?: Yes,  pt plans to pay for her own taxi Do you have the ability to pay for your medications: Yes,  mental health  Release of information consent forms completed and submitted to medical records by CSW.  Patient to Follow up at: Follow-up Information    Follow up with [facility].   Why:  Walk in between 8am-12pm Monday through Friday for hospital follow-up/assessment for mental health/substance abuse outpatient services.    Contact information:   [address] [place], [place] [address] Phone: [phone] Fax: [phone]      Next level of care provider has access to [facility] Link:no  Safety Planning and Suicide Prevention discussed: Yes, pt did not consent to family contact. SPE completed with pt and she was provided with SPI pamphlet, mobile crisis information.   Have you used any form of tobacco in the last 30 days? (Cigarettes, Smokeless Tobacco, Cigars, and/or Pipes): Yes  Has patient been referred to the Quitline?: Patient refused referral  Patient has been [facility] for addiction [facility]: [facility] (SEE ABOVE)  Smart, Majorie Santee LCSW 11/24/2015, 11:53 AM

## 2015-11-24 NOTE — Progress Notes (Signed)
Recreation Therapy Notes  Date: 12.26.2016 Time: 9:30am  Location: [address] Day[address]   Group Topic: Stress Management  Goal Area(s) Addresses:  Patient will actively participate in stress management techniques presented during session.   Behavioral Response: Did not attend.   Kedric Bumgarner L Annalisse Minkoff, LRT/CTRS         Rosalba Totty L 11/24/2015 12:06 PM 

## 2015-11-28 ENCOUNTER — Encounter: Payer: Self-pay | Admitting: Emergency Medicine

## 2015-11-28 ENCOUNTER — Emergency Department
Admission: EM | Admit: 2015-11-28 | Discharge: 2015-11-28 | Disposition: A | Discharge status: Home / Self Care | Payer: Self-pay | Attending: Emergency Medicine | Admitting: Emergency Medicine

## 2015-11-28 DIAGNOSIS — F1029 Alcohol dependence with unspecified alcohol-induced disorder: Secondary | ICD-10-CM | POA: Insufficient documentation

## 2015-11-28 DIAGNOSIS — F17210 Nicotine dependence, cigarettes, uncomplicated: Secondary | ICD-10-CM | POA: Insufficient documentation

## 2015-11-28 DIAGNOSIS — F1020 Alcohol dependence, uncomplicated: Secondary | ICD-10-CM

## 2015-11-28 DIAGNOSIS — Z792 Long term (current) use of antibiotics: Secondary | ICD-10-CM | POA: Insufficient documentation

## 2015-11-28 DIAGNOSIS — Z79899 Other long term (current) drug therapy: Secondary | ICD-10-CM | POA: Insufficient documentation

## 2015-11-28 DIAGNOSIS — F329 Major depressive disorder, single episode, unspecified: Secondary | ICD-10-CM | POA: Insufficient documentation

## 2015-11-28 HISTORY — DX: Unspecified osteoarthritis, unspecified site: M19.90

## 2015-11-28 NOTE — ED Notes (Signed)
Patient to ER via [facility] for c/o ETOH intoxication. Patient stating she wants to go home as soon as she gets to stretcher. Patient denies SI/HI. Patient tearful, but states "I just drink a shit ton".

## 2015-11-28 NOTE — ED Notes (Signed)
Patient discharged at this time, but has no way to safely get home d/t intoxication. Once patient sobers more, she will be discharged home.

## 2015-11-28 NOTE — Discharge Instructions (Signed)
Alcohol Use Disorder °Alcohol use disorder is a mental disorder. It is not a one-time incident of heavy drinking. Alcohol use disorder is the excessive and uncontrollable use of alcohol over time that leads to problems with functioning in one or more areas of daily living. People with this disorder risk harming themselves and others when they drink to excess. Alcohol use disorder also can cause other mental disorders, such as mood and anxiety disorders, and serious physical problems. People with alcohol use disorder often misuse other drugs.  °Alcohol use disorder is common and widespread. Some people with this disorder drink alcohol to cope with or escape from negative life events. Others drink to relieve chronic pain or symptoms of mental illness. People with a family history of alcohol use disorder are at higher risk of losing control and using alcohol to excess.  °Drinking too much alcohol can cause injury, accidents, and health problems. One drink can be too much when you are: °· Working. °· Pregnant or breastfeeding. °· Taking medicines. Ask your doctor. °· Driving or planning to drive. °SYMPTOMS  °Signs and symptoms of alcohol use disorder may include the following:  °· Consumption of alcohol in larger amounts or over a longer period of time than intended. °· Multiple unsuccessful attempts to cut down or control alcohol use.   °· A great deal of time spent obtaining alcohol, using alcohol, or recovering from the effects of alcohol (hangover). °· A strong desire or urge to use alcohol (cravings).   °· Continued use of alcohol despite problems at work, school, or home because of alcohol use.   °· Continued use of alcohol despite problems in relationships because of alcohol use. °· Continued use of alcohol in situations when it is physically hazardous, such as driving a car. °· Continued use of alcohol despite awareness of a physical or psychological problem that is likely related to alcohol use. Physical  problems related to alcohol use can involve the brain, heart, liver, stomach, and intestines. Psychological problems related to alcohol use include intoxication, depression, anxiety, psychosis, delirium, and dementia.   °· The need for increased amounts of alcohol to achieve the same desired effect, or a decreased effect from the consumption of the same amount of alcohol (tolerance). °· Withdrawal symptoms upon reducing or stopping alcohol use, or alcohol use to reduce or avoid withdrawal symptoms. Withdrawal symptoms include: °¨ Racing heart. °¨ Hand tremor. °¨ Difficulty sleeping. °¨ Nausea. °¨ Vomiting. °¨ Hallucinations. °¨ Restlessness. °¨ Seizures. °DIAGNOSIS °Alcohol use disorder is diagnosed through an assessment by your health care provider. Your health care provider may start by asking three or four questions to screen for excessive or problematic alcohol use. To confirm a diagnosis of alcohol use disorder, at least two symptoms must be present within a 12-month period. The severity of alcohol use disorder depends on the number of symptoms: °· Mild--two or three. °· Moderate--four or five. °· Severe--six or more. °Your health care provider may perform a physical exam or use results from lab tests to see if you have physical problems resulting from alcohol use. Your health care provider may refer you to a mental health professional for evaluation. °TREATMENT  °Some people with alcohol use disorder are able to reduce their alcohol use to low-risk levels. Some people with alcohol use disorder need to quit drinking alcohol. When necessary, mental health professionals with specialized training in substance use treatment can help. Your health care provider can help you decide how severe your alcohol use disorder is and what type of treatment you need.   The following forms of treatment are available:   Detoxification. Detoxification involves the use of prescription medicines to prevent alcohol withdrawal  symptoms in the first week after quitting. This is important for people with a history of symptoms of withdrawal and for heavy drinkers who are likely to have withdrawal symptoms. Alcohol withdrawal can be dangerous and, in severe cases, cause death. Detoxification is usually provided in a hospital or in-patient substance use treatment facility.  Counseling or talk therapy. Talk therapy is provided by substance use treatment counselors. It addresses the reasons people use alcohol and ways to keep them from drinking again. The goals of talk therapy are to help people with alcohol use disorder find healthy activities and ways to cope with life stress, to identify and avoid triggers for alcohol use, and to handle cravings, which can cause relapse.  Medicines.Different medicines can help treat alcohol use disorder through the following actions:  Decrease alcohol cravings.  Decrease the positive reward response felt from alcohol use.  Produce an uncomfortable physical reaction when alcohol is used (aversion therapy).  Support groups. Support groups are run by people who have quit drinking. They provide emotional support, advice, and guidance. These forms of treatment are often combined. Some people with alcohol use disorder benefit from intensive combination treatment provided by specialized substance use treatment centers. Both inpatient and outpatient treatment programs are available.   This information is not intended to replace advice given to you by your health care provider. Make sure you discuss any questions you have with your health care provider.   Document Released: 12/23/2004 Document Revised: 12/06/2014 Document Reviewed: 02/22/2013 Elsevier Interactive Patient Education 2016 [employer].  Finding Treatment for Addiction WHAT IS ADDICTION? Addiction is a complex disease of the brain. It causes an uncontrollable (compulsive) need for a substance. You can be addicted to alcohol,  illegal drugs, or prescription medicines such as painkillers. Addiction can also be a behavior, like gambling or shopping. The need for the drug or activity can become so strong that you think about it all the time. You can also become physically dependent on a substance. Addiction can change the way your brain works. Because of these changes, getting more of whatever you are addicted to becomes the most important thing to you and feels better than other activities or relationships. Addiction can lead to changes in health, behavior, emotions, relationships, and choices that affect you and everyone around you. HOW DO I KNOW IF I NEED TREATMENT FOR ADDICTION? Addiction is a progressive disease. Without treatment, addiction can get worse. Living with addiction puts you at higher risk for injury, poor health, lost employment, loss of money, and even death. You might need treatment for addiction if:  You have tried to stop or cut down, but you cannot.  Your addiction is causing physical health problems.  You find it annoying that your friends and family are concerned about your alcohol or substance use.  You feel guilty about substance abuse or a compulsive behavior.  You have lied or tried to hide your addiction.  You need a particular substance or activity to start your day or to calm down.  You are getting in trouble at school, work, home, or with the police.  You have done something illegal to support your addiction.  You are running out of money because of your addiction.  You have no time for anything other than your addiction. WHAT TYPES OF TREATMENT ARE AVAILABLE? The treatment program that is right for you  will depend on many factors, including the type of addiction you have. Treatment programs can be outpatient or inpatient. In an outpatient program, you live at home and go to work or school, but you also go to a clinic for treatment. With an inpatient program, you live and sleep at  the program facility during treatment. After treatment, you might need a plan for support during recovery. Other treatment options include:   Medicine.  Some addictions may be treated with prescription medicines.  You might also need medicine to treat anxiety or depression.  Counseling and behavior therapy. Therapy can help individuals and families behave in healthier ways and relate more effectively.  Support groups. Confidential group therapy, such as a 12-step program, can help individuals and families during treatment and recovery. No single type of program is right for everyone. Many treatment programs involve a combination of education, counseling, and a 12-step, spiritually-based approach. Some treatment programs are government sponsored. They are geared for patients who do not have private insurance. Treatment programs can vary in many respects, such as:  Cost and types of insurance that are accepted.  Types of on-site medical services that are offered.  Length of stay, setting, and size.  Overall philosophy of treatment. WHAT SHOULD I CONSIDER WHEN SELECTING A TREATMENT PROGRAM? It is important to think about your individual requirements when selecting a treatment program. There are a number of things to consider, such as:  If the program is certified by the appropriate government agency. Even private programs must be certified and employ certified professionals.  If the program is covered by your insurance. If finances are a concern, the first call you should make is to your insurance company, if you have health insurance. Ask for a list of treatment programs that are in your network, and confirm any copayments and deductibles that you may have to pay.  If you do not have insurance, or if you choose to attend a program that does not accept your insurance, discuss whether a payment plan can be set up.  If treatment is available in languages other than English, if needed.  If  the program offers detoxification treatment, if needed.  If 12-step meetings are held at the center or if transport is available for patients to attend meetings at other locations.  If the program is professional, organized, and clean.  If the program meets all of your needs, including physical and cultural needs.  If the facility offers specific treatment for your particular addiction.  If support continues to be offered after you have left the program.  If your treatment plan is continually looked at to make sure you are receiving the right treatment at the right time.  If mental health counseling is part of your treatment.  If medicine is included in treatment, if needed.  If your family is included in your treatment plan and if support is offered to them throughout the treatment process.  How the treatment works to prevent relapse. WHERE ELSE CAN I GET HELP?  Your health care provider. Ask him or her to help you find addiction treatment. These discussions are confidential.  The [employer] on Alcoholism and Drug Dependence (NCADD). This group has information about treatment centers and programs for people who have an addiction and for family members.  The telephone number is 1-800-NCA-CALL (204-847-2680).  The website is https://ncadd.org/about-ncadd/our-affiliates  The [facility] and Mental [facility]). This group will help you find publicly funded treatment centers, help hotlines,  and counseling services near you.  The telephone number is 1-800-662-HELP ((219)173-05291-520 469 8956).  The website is www.findtreatment.[employer].[place]sam[place].gov In countries outside of the [place], look in [employer] directories for contact information for services in your area.   This information is not intended to replace advice given to you by your health care provider. Make sure you discuss any questions you have with your health care provider.   Document Released:  10/14/2005 Document Revised: 08/06/2015 Document Reviewed: 09/03/2014 Elsevier Interactive Patient Education [employer]2016 Elsevier Inc.

## 2015-11-28 NOTE — ED Provider Notes (Signed)
[facility] Emergency Department Provider Note  ____________________________________________  Time seen: 1:45 PM  I have reviewed the triage vital signs and the nursing notes.   HISTORY  Chief Complaint Alcohol Problem    HPI Amanda Huff is a 54 y.o. female who reports a history of alcoholism. Today she ran out of alcohol at home so she called EMS to be brought to the ED for evaluation. She reports that she drinks "too much" but is unable to quantify. Last drink was earlier today. Denies any trauma or other illnesses. She reports that she does not want detox or rehabilitation and we'll just wants to go home and continue drinking. Denies suicidal ideation and homicidal ideation or hallucinations emphatically. She is unable to state the reason why she called EMS to come to the emergency department. She only is able to state that she wants to go home and drink alcohol.  She reports being very hungry and wants to eat   Past Medical History  Diagnosis Date  . ETOH abuse   . Arthritis      Patient Active Problem List   Diagnosis Date Noted  . Alcohol use disorder, severe, dependence (HCC) 11/11/2015  . Alcohol abuse with alcohol-induced mood disorder (HCC) 10/29/2015  . Depression, major, recurrent, moderate (HCC) 10/29/2015     Past Surgical History  Procedure Laterality Date  . Cesarean section       Current Outpatient Rx  Name  Route  Sig  Dispense  Refill  . amoxicillin (AMOXIL) 500 MG capsule   Oral   Take 1 capsule (500 mg total) by mouth every 12 (twelve) hours. For infection         . FLUoxetine (PROZAC) 40 MG capsule   Oral   Take 1 capsule (40 mg total) by mouth daily. For depression   30 capsule   0   . hydrOXYzine (ATARAX/VISTARIL) 25 MG tablet   Oral   Take 1 tablet (25 mg total) by mouth every 6 (six) hours as needed for anxiety.   60 tablet   0   . mirtazapine (REMERON) 7.5 MG tablet   Oral   Take 1 tablet (7.5 mg  total) by mouth at bedtime. For insomnia   30 tablet   0   . nicotine (NICODERM CQ - DOSED IN MG/24 HOURS) 21 mg/24hr patch   Transdermal   Place 1 patch (21 mg total) onto the skin daily. For smoking cessation   28 patch   0   . risperiDONE (RISPERDAL) 0.5 MG tablet   Oral   Take 1 tablet (0.5 mg total) by mouth 2 (two) times daily. For mood control   60 tablet   0      Allergies Review of patient's allergies indicates no known allergies.   No family history on file.  Social History Social History  Substance Use Topics  . Smoking status: Current Every Day Smoker -- 0.50 packs/day    Types: Cigarettes  . Smokeless tobacco: None  . Alcohol Use: Yes     Comment: occasionally    Review of Systems  Constitutional:   No fever or chills. No weight changes Eyes:   No blurry vision or double vision.  ENT:   No sore throat. Cardiovascular:   No chest pain. Respiratory:   No dyspnea or cough. Gastrointestinal:   Negative for abdominal pain, vomiting and diarrhea.  No BRBPR or melena. Genitourinary:   Negative for dysuria, urinary retention, bloody urine, or difficulty urinating. Musculoskeletal:  Negative for back pain. No joint swelling or pain. Skin:   Negative for rash. Neurological:   Negative for headaches, focal weakness or numbness. Psychiatric:  No anxiety or depression.   Endocrine:  No hot/cold intolerance, changes in energy, or sleep difficulty.  10-point ROS otherwise negative.  ____________________________________________   PHYSICAL EXAM:  VITAL SIGNS: ED Triage Vitals  Enc Vitals Group     BP 11/28/15 1344 127/89 mmHg     Pulse Rate 11/28/15 1344 97     Resp 11/28/15 1344 20     Temp 11/28/15 1344 98.4 F (36.9 C)     Temp Source 11/28/15 1344 Oral     SpO2 11/28/15 1344 93 %     Weight 11/28/15 1344 134 lb (60.782 kg)     Height 11/28/15 1344  (1.702 m)     Head Cir --      Peak Flow --      Pain Score --      Pain Loc --      Pain  Edu? --      Excl. in GC? --     Vital signs reviewed, nursing assessments reviewed.   Constitutional:   Alert and oriented. Well appearing and in no distress. Eyes:   No scleral icterus. No conjunctival pallor. PERRL. EOMI ENT   Head:   Normocephalic and atraumatic.   Nose:   No congestion/rhinnorhea. No septal hematoma   Mouth/Throat:   MMM, no pharyngeal erythema. No peritonsillar mass. No uvula shift.   Neck:   No stridor. No SubQ emphysema. No meningismus. Hematological/Lymphatic/Immunilogical:   No cervical lymphadenopathy. Cardiovascular:   RRR. Normal and symmetric distal pulses are present in all extremities. No murmurs, rubs, or gallops. Respiratory:   Normal respiratory effort without tachypnea nor retractions. Breath sounds are clear and equal bilaterally. No wheezes/rales/rhonchi. Gastrointestinal:   Soft and nontender. No distention. There is no CVA tenderness.  No rebound, rigidity, or guarding. Genitourinary:   deferred Musculoskeletal:   Nontender with normal range of motion in all extremities. No joint effusions.  No lower extremity tenderness.  No edema. Neurologic:   Normal speech and language.  CN 2-10 normal. Motor grossly intact. No pronator drift.  Normal gait. No gross focal neurologic deficits are appreciated.  Skin:    Skin is warm, dry and intact. No rash noted.  No petechiae, purpura, or bullae. Psychiatric:   Depressed mood and affect, occasionally tearful.Marland Kitchen Speech and behavior are normal. Patient exhibits appropriate insight and judgment.  ____________________________________________    LABS (pertinent positives/negatives) (all labs ordered are listed, but only abnormal results are displayed) Labs Reviewed - No data to display ____________________________________________   EKG    ____________________________________________     RADIOLOGY    ____________________________________________   PROCEDURES   ____________________________________________   INITIAL IMPRESSION / ASSESSMENT AND PLAN / ED COURSE  Pertinent labs & imaging results that were available during my care of the patient were reviewed by me and considered in my medical decision making (see chart for details).  Patient presents without any acute complaints. She does report alcoholism with recent ingestion but does not appear to be intoxicated at this time. She has clear linear rational thought, clear speech, tolerating oral intake, steady gait. Vital signs are normal, exam is reassuring and does not reveal any other abnormalities. At this time the patient only states that she wants to go home and there is no evidence of a threat to herself or others no criteria for commitment, so  we will discharge her and have her follow-up. Patient is given information on mental health clinics as well as alcohol dependence and finding treatment. I discussed with her seeking an Alcoholics Anonymous group as well. She states that she has been to AA in the past and will consider going again.     ____________________________________________   FINAL CLINICAL IMPRESSION(S) / ED DIAGNOSES  Final diagnoses:  Alcohol use disorder, severe, dependence (HCC)      Sharman Cheek, MD 11/28/15 1410

## 2015-12-01 ENCOUNTER — Emergency Department
Admission: EM | Admit: 2015-12-01 | Discharge: 2015-12-01 | Disposition: A | Discharge status: Home / Self Care | Payer: Self-pay | Attending: Emergency Medicine | Admitting: Emergency Medicine

## 2015-12-01 DIAGNOSIS — F17210 Nicotine dependence, cigarettes, uncomplicated: Secondary | ICD-10-CM | POA: Insufficient documentation

## 2015-12-01 DIAGNOSIS — F329 Major depressive disorder, single episode, unspecified: Secondary | ICD-10-CM | POA: Insufficient documentation

## 2015-12-01 DIAGNOSIS — Z792 Long term (current) use of antibiotics: Secondary | ICD-10-CM | POA: Insufficient documentation

## 2015-12-01 DIAGNOSIS — F1020 Alcohol dependence, uncomplicated: Secondary | ICD-10-CM | POA: Insufficient documentation

## 2015-12-01 DIAGNOSIS — Z79899 Other long term (current) drug therapy: Secondary | ICD-10-CM | POA: Insufficient documentation

## 2015-12-01 NOTE — ED Notes (Addendum)
Pt came via EMS. Per EMS, they were called to her home earlier requesting detox but stated she was going to come in a private vehicle. EMS sts that her plans getting a ride fell through and they were called back to take her to the hospital. Pt sts she had 1 gallon of vodka between yesterday and today. Pt in tears stating she is depressed and misses [place]. Pt sts last drink was at 1400.

## 2015-12-01 NOTE — ED Notes (Signed)
Pt assisted to bathroom. Steady gait noted.

## 2015-12-01 NOTE — ED Notes (Signed)
Pt given meal

## 2015-12-01 NOTE — ED Provider Notes (Signed)
[facility] Emergency Department Provider Note  ____________________________________________  Time seen: Approximately 6:16 PM  I have reviewed the triage vital signs and the nursing notes.   HISTORY  Chief Complaint Alcohol Problem  History limited by alcohol intoxication  HPI Amanda Huff is a 55 y.o. female with a history of alcohol abusewho presents by EMS for alcohol abuse and wanting detox.  She came to the emergency department several days ago with a similar complaint and was referred to Alcoholics Anonymous as well as the other outpatient resources that we have available.  She states that she did not follow up on it.  She adamantly denies any suicidal ideation or homicidal ideation as well as any hallucinations.  She states that she is just depressed because her boyfriend lives in [place] but she has no desire to hurt or kill herself.  She knows that she has a problem and no suctioning seek treatment.  She denies ever having any seizures or hallucinations when not drinking.  She reports that she has chronic arthritis but no other medical problems.  She denies any other substance abuse issues.  She reports that her symptoms are severe and gradual in onset and persistent for years.   Past Medical History  Diagnosis Date  . ETOH abuse   . Arthritis     Patient Active Problem List   Diagnosis Date Noted  . Alcohol use disorder, severe, dependence (HCC) 11/11/2015  . Alcohol abuse with alcohol-induced mood disorder (HCC) 10/29/2015  . Depression, major, recurrent, moderate (HCC) 10/29/2015    Past Surgical History  Procedure Laterality Date  . Cesarean section      Current Outpatient Rx  Name  Route  Sig  Dispense  Refill  . amoxicillin (AMOXIL) 500 MG capsule   Oral   Take 1 capsule (500 mg total) by mouth every 12 (twelve) hours. For infection         . FLUoxetine (PROZAC) 40 MG capsule   Oral   Take 1 capsule (40 mg total) by mouth daily.  For depression   30 capsule   0   . hydrOXYzine (ATARAX/VISTARIL) 25 MG tablet   Oral   Take 1 tablet (25 mg total) by mouth every 6 (six) hours as needed for anxiety.   60 tablet   0   . mirtazapine (REMERON) 7.5 MG tablet   Oral   Take 1 tablet (7.5 mg total) by mouth at bedtime. For insomnia   30 tablet   0   . nicotine (NICODERM CQ - DOSED IN MG/24 HOURS) 21 mg/24hr patch   Transdermal   Place 1 patch (21 mg total) onto the skin daily. For smoking cessation   28 patch   0   . risperiDONE (RISPERDAL) 0.5 MG tablet   Oral   Take 1 tablet (0.5 mg total) by mouth 2 (two) times daily. For mood control   60 tablet   0     Allergies Review of patient's allergies indicates no known allergies.  No family history on file.  Social History Social History  Substance Use Topics  . Smoking status: Current Every Day Smoker -- 0.50 packs/day    Types: Cigarettes  . Smokeless tobacco: None  . Alcohol Use: Yes     Comment: 1 bottle of vodka every 3 days    Review of Systems Constitutional: No fever/chills Eyes: No visual changes. ENT: No sore throat. Cardiovascular: Denies chest pain. Respiratory: Denies shortness of breath. Gastrointestinal: No abdominal pain.  No nausea, no vomiting.  No diarrhea.  No constipation. Genitourinary: Negative for dysuria. Musculoskeletal: Negative for back pain. Skin: Negative for rash. Neurological: Negative for headaches, focal weakness or numbness. Psychiatric:Chronic alcohol abuse and current intoxication but without suicidal ideation, homicidal ideation, history of complicated withdrawal, nor hallucinations  10-point ROS otherwise negative.  ____________________________________________   PHYSICAL EXAM:  VITAL SIGNS: ED Triage Vitals  Enc Vitals Group     BP 12/01/15 1600 93/88 mmHg     Pulse Rate 12/01/15 1600 90     Resp 12/01/15 1600 18     Temp 12/01/15 1600 97.9 F (36.6 C)     Temp Source 12/01/15 1600 Oral     SpO2  12/01/15 1600 95 %     Weight 12/01/15 1600 130 lb (58.968 kg)     Height 12/01/15 1600 5\' 6"  (1.676 m)     Head Cir --      Peak Flow --      Pain Score 12/01/15 1602 0     Pain Loc --      Pain Edu? --      Excl. in GC? --     Constitutional: Alert and oriented in spite of history and appearance of alcohol intoxication. Eyes: Conjunctivae are normal. PERRL. EOMI. Head: Atraumatic. Nose: No congestion/rhinnorhea. Mouth/Throat: Mucous membranes are moist.  Oropharynx non-erythematous. Neck: No stridor.   Cardiovascular: Normal rate, regular rhythm. Grossly normal heart sounds.  Good peripheral circulation. Respiratory: Normal respiratory effort.  No retractions. Lungs CTAB. Gastrointestinal: Soft and nontender. No distention. No abdominal bruits. No CVA tenderness. Musculoskeletal: No lower extremity tenderness nor edema.  No joint effusions. Neurologic:  Normal speech and language. No gross focal neurologic deficits are appreciated.  Patient ambulating without any difficulty to and from the bathroom gait Skin:  Skin is warm, dry and intact. No rash noted. Psychiatric: Mood and affect are normal. Speech is slightly slurred.  Denies SI/HI.  Denies hallucinations.  Denies any prior suicide attempt.  States "oh hell no" when I ask her if she had any intention to hurt herself or if she was drinking with the intent to hurt herself.  ____________________________________________   LABS (all labs ordered are listed, but only abnormal results are displayed)  Labs Reviewed - No data to display ____________________________________________  EKG  Not indicated ____________________________________________  RADIOLOGY   No results found.  ____________________________________________   PROCEDURES  Procedure(s) performed: None  Critical Care performed: No ____________________________________________   INITIAL IMPRESSION / ASSESSMENT AND PLAN / ED COURSE  Pertinent labs & imaging  results that were available during my care of the patient were reviewed by me and considered in my medical decision making (see chart for details).  The patient does not meet any inpatient psychiatric criteria.  Additionally she does not meet inpatient medical criteria as she has never had any complicated withdrawal.  She has not yet followed up with outpatient resources.  I ask TTS to come by and give her all the resources again.  The patient states that she understands she needs help and states that she fully and tendons to follow-up with any one of the places that we provided, including Alcoholics Anonymous, [facility], [facility], etc.  She has no one to come pick her up so we are attempting to find her a ride.  ____________________________________________  FINAL CLINICAL IMPRESSION(S) / ED DIAGNOSES  Final diagnoses:  Alcohol use disorder, severe, dependence (HCC)      NEW MEDICATIONS STARTED DURING THIS VISIT:  Discharge  Medication List as of 12/01/2015  7:20 PM       Loleta Rose, MD 12/01/15 2339

## 2015-12-01 NOTE — Discharge Instructions (Signed)
You were seen in the emergency department for alcohol intoxication.  Please seek help from the recommended resources for assistance with your alcohol dependence.  If you have any thoughts of hurting herself or others, please call 911 or return to the emergency department.  Please avoid drug and alcohol use.  Never drive a vehicle or operate machinery while intoxicated. ° ° °Alcohol Intoxication °Alcohol intoxication occurs when the amount of alcohol that a person has consumed impairs his or her ability to mentally and physically function. Alcohol directly impairs the normal chemical activity of the brain. Drinking large amounts of alcohol can lead to changes in mental function and behavior, and it can cause many physical effects that can be harmful.  °Alcohol intoxication can range in severity from mild to very severe. Various factors can affect the level of intoxication that occurs, such as the person's age, gender, weight, frequency of alcohol consumption, and the presence of other medical conditions (such as diabetes, seizures, or heart conditions). Dangerous levels of alcohol intoxication may occur when people drink large amounts of alcohol in a short period (binge drinking). Alcohol can also be especially dangerous when combined with certain prescription medicines or "recreational" drugs. °SIGNS AND SYMPTOMS °Some common signs and symptoms of mild alcohol intoxication include: °Loss of coordination. °Changes in mood and behavior. °Impaired judgment. °Slurred speech. °As alcohol intoxication progresses to more severe levels, other signs and symptoms will appear. These may include: °Vomiting. °Confusion and impaired memory. °Slowed breathing. °Seizures. °Loss of consciousness. °DIAGNOSIS  °Your health care provider will take a medical history and perform a physical exam. You will be asked about the amount and type of alcohol you have consumed. Blood tests will be done to measure the concentration of alcohol in  your blood. In many places, your blood alcohol level must be lower than 80 mg/dL (0.08%) to legally drive. However, many dangerous effects of alcohol can occur at much lower levels.  °TREATMENT  °People with alcohol intoxication often do not require treatment. Most of the effects of alcohol intoxication are temporary, and they go away as the alcohol naturally leaves the body. Your health care provider will monitor your condition until you are stable enough to go home. Fluids are sometimes given through an IV access tube to help prevent dehydration.  °HOME CARE INSTRUCTIONS °Do not drive after drinking alcohol. °Stay hydrated. Drink enough water and fluids to keep your urine clear or pale yellow. Avoid caffeine.   °Only take over-the-counter or prescription medicines as directed by your health care provider.   °SEEK MEDICAL CARE IF:  °You have persistent vomiting.   °You do not feel better after a few days. °You have frequent alcohol intoxication. Your health care provider can help determine if you should see a substance use treatment counselor. °SEEK IMMEDIATE MEDICAL CARE IF:  °You become shaky or tremble when you try to stop drinking.   °You shake uncontrollably (seizure).   °You throw up (vomit) blood. This may be bright red or may look like black coffee grounds.   °You have blood in your stool. This may be bright red or may appear as a black, tarry, bad smelling stool.   °You become lightheaded or faint.   °MAKE SURE YOU:  °Understand these instructions. °Will watch your condition. °Will get help right away if you are not doing well or get worse. °Document Released: 08/25/2005 Document Revised: 07/18/2013 Document Reviewed: 04/20/2013 °Exit[employer]® Patient Information ©2015 [employer]. This information is not intended to replace advice   given to you by your health care provider. Make sure you discuss any questions you have with your health care provider. ° °Finding Treatment for Alcohol and Drug Addiction °It can  be hard to find the right place to get professional treatment. Here are some important things to consider: °There are different types of treatment to choose from. °Some programs are live-in (residential) while others are not (outpatient). Sometimes a combination is offered. °No single type of program is right for everyone. °Most treatment programs involve a combination of education, counseling, and a 12-step, spiritually-based approach. °There are non-spiritually based programs (not 12-step). °Some treatment programs are government sponsored. They are geared for patients without private insurance. °Treatment programs can vary in many respects such as: °Cost and types of insurance accepted. °Types of on-site medical services offered. °Length of stay, setting, and size. °Overall philosophy of treatment.  °A person may need specialized treatment or have needs not addressed by all programs. For example, adolescents need treatment appropriate for their age. Other people have secondary disorders that must be managed as well. Secondary conditions can include mental illness, such as depression or diabetes. Often, a period of detoxification from alcohol or drugs is needed. This requires medical supervision and not all programs offer this. °THINGS TO CONSIDER WHEN SELECTING A TREATMENT PROGRAM  °Is the program certified by the appropriate government agency? Even private programs must be certified and employ certified professionals. °Does the program accept your insurance? If not, can a payment plan be set up? °Is the facility clean, organized, and well run? Do they allow you to speak with graduates who can share their treatment experience with you? Can you tour the facility? Can you meet with staff? °Does the program meet the full range of individual needs? °Does the treatment program address sexual orientation and physical disabilities? Do they provide age, gender, and culturally appropriate treatment services? °Is treatment  available in languages other than English? °Is long-term aftercare support or guidance encouraged and provided? °Is assessment of an individual's treatment plan ongoing to ensure it meets changing needs? °Does the program use strategies to encourage reluctant patients to remain in treatment long enough to increase the likelihood of success? °Does the program offer counseling (individual or group) and other behavioral therapies? °Does the program offer medicine as part of the treatment regimen, if needed? °Is there ongoing monitoring of possible relapse? Is there a defined relapse prevention program? Are services or referrals offered to family members to ensure they understand addiction and the recovery process? This would help them support the recovering individual. °Are 12-step meetings held at the center or is transport available for patients to attend outside meetings? °In countries outside of the [place] and [place], see local directories for contact information for services in your area. °Document Released: 10/14/2005 Document Revised: 02/07/2012 Document Reviewed: 04/25/2008 °Exit[employer]® Patient Information ©2015 [employer]. This information is not intended to replace advice given to you by your health care provider. Make sure you discuss any questions you have with your health care provider. ° ° °

## 2015-12-01 NOTE — ED Notes (Signed)
Pt asking for food multiple times. Informed must be seen by doctor first. Pt c/o being hungry and not eating for 2 days. Told triaging RN that she has not eaten because was drinking.

## 2016-01-20 ENCOUNTER — Emergency Department: Payer: Self-pay

## 2016-01-20 ENCOUNTER — Encounter: Payer: Self-pay | Admitting: *Deleted

## 2016-01-20 ENCOUNTER — Emergency Department
Admission: EM | Admit: 2016-01-20 | Discharge: 2016-01-21 | Disposition: A | Discharge status: Home / Self Care | Payer: Self-pay | Attending: Emergency Medicine | Admitting: Emergency Medicine

## 2016-01-20 DIAGNOSIS — F10920 Alcohol use, unspecified with intoxication, uncomplicated: Secondary | ICD-10-CM

## 2016-01-20 DIAGNOSIS — F1310 Sedative, hypnotic or anxiolytic abuse, uncomplicated: Secondary | ICD-10-CM | POA: Insufficient documentation

## 2016-01-20 DIAGNOSIS — F17210 Nicotine dependence, cigarettes, uncomplicated: Secondary | ICD-10-CM | POA: Insufficient documentation

## 2016-01-20 DIAGNOSIS — G43809 Other migraine, not intractable, without status migrainosus: Secondary | ICD-10-CM

## 2016-01-20 DIAGNOSIS — Z792 Long term (current) use of antibiotics: Secondary | ICD-10-CM | POA: Insufficient documentation

## 2016-01-20 DIAGNOSIS — F329 Major depressive disorder, single episode, unspecified: Secondary | ICD-10-CM | POA: Insufficient documentation

## 2016-01-20 DIAGNOSIS — F10120 Alcohol abuse with intoxication, uncomplicated: Secondary | ICD-10-CM | POA: Insufficient documentation

## 2016-01-20 DIAGNOSIS — Z79899 Other long term (current) drug therapy: Secondary | ICD-10-CM | POA: Insufficient documentation

## 2016-01-20 DIAGNOSIS — J322 Chronic ethmoidal sinusitis: Secondary | ICD-10-CM

## 2016-01-20 LAB — COMPREHENSIVE METABOLIC PANEL
ALBUMIN: 4.2 g/dL (ref 3.5–5.0)
ALT: 96 U/L — AB (ref 14–54)
AST: 121 U/L — AB (ref 15–41)
Alkaline Phosphatase: 106 U/L (ref 38–126)
Anion gap: 25 — ABNORMAL HIGH (ref 5–15)
BUN: 16 mg/dL (ref 6–20)
CHLORIDE: 94 mmol/L — AB (ref 101–111)
CO2: 20 mmol/L — AB (ref 22–32)
CREATININE: 0.59 mg/dL (ref 0.44–1.00)
Calcium: 8.7 mg/dL — ABNORMAL LOW (ref 8.9–10.3)
GFR calc Af Amer: >60 mL/min (ref >60)
GLUCOSE: 72 mg/dL (ref 65–99)
POTASSIUM: 3.9 mmol/L (ref 3.5–5.1)
SODIUM: 139 mmol/L (ref 135–145)
Total Bilirubin: 1.2 mg/dL (ref 0.3–1.2)
Total Protein: 7.1 g/dL (ref 6.5–8.1)

## 2016-01-20 LAB — CBC
HEMATOCRIT: 44.9 % (ref 35.0–47.0)
Hemoglobin: 15.4 g/dL (ref 12.0–16.0)
MCH: 31.5 pg (ref 26.0–34.0)
MCHC: 34.4 g/dL (ref 32.0–36.0)
MCV: 91.5 fL (ref 80.0–100.0)
PLATELETS: 249 10*3/uL (ref 150–440)
RBC: 4.9 MIL/uL (ref 3.80–5.20)
RDW: 13.5 % (ref 11.5–14.5)
WBC: 7 10*3/uL (ref 3.6–11.0)

## 2016-01-20 LAB — ETHANOL: Alcohol, Ethyl (B): 359 mg/dL (ref <5)

## 2016-01-20 MED ORDER — SODIUM CHLORIDE 0.9 % IV BOLUS (SEPSIS)
1000.0000 mL | Freq: Once | INTRAVENOUS | Status: AC
  Administered 2016-01-20: 1000 mL via INTRAVENOUS

## 2016-01-20 MED ORDER — FENTANYL CITRATE (PF) 100 MCG/2ML IJ SOLN: 25.0000 ug | Freq: Once | INTRAMUSCULAR | Status: DC

## 2016-01-20 MED ORDER — ONDANSETRON HCL 4 MG/2ML IJ SOLN
4.0000 mg | Freq: Once | INTRAMUSCULAR | Status: AC
  Administered 2016-01-20: 4 mg via INTRAVENOUS
  Filled 2016-01-20: qty 2

## 2016-01-20 NOTE — ED Notes (Signed)
Offered pt ginger ale to drink and she refused stating she only wants "steak" - Then she began to rest quietly again - refused pain medication at this time and denied pain but did state she was nauseated and zofran was given as ordered

## 2016-01-20 NOTE — ED Notes (Signed)
MD Norman at bedside 

## 2016-01-20 NOTE — ED Provider Notes (Signed)
[facility] Emergency Department Provider Note  ____________________________________________  Time seen: Approximately 8:57 PM  I have reviewed the triage vital signs and the nursing notes.   HISTORY  Chief Complaint Headache    HPI Kolleen Ochsner is a 55 y.o. female with a history of EtOH dependence, migraines, presenting for headache. Patient reports that since yesterday she has had headache that starts at her nose and then shoots straight backwards. This is typical of her previous migraines. She has tried 2 Tylenol PM) (reports 4 to the nurse) and vodka yesterday and this morning without improvement. She has associated nausea without vomiting, light sensitivity and noise sensitivity. She denies any visual changes, numbness tingling or weakness, or recent trauma. No recent tick bites. No fever or neck pain or stiffness.   Past Medical History  Diagnosis Date  . ETOH abuse   . Arthritis     Patient Active Problem List   Diagnosis Date Noted  . Alcohol use disorder, severe, dependence (HCC) 11/11/2015  . Alcohol abuse with alcohol-induced mood disorder (HCC) 10/29/2015  . Depression, major, recurrent, moderate (HCC) 10/29/2015    Past Surgical History  Procedure Laterality Date  . Cesarean section      Current Outpatient Rx  Name  Route  Sig  Dispense  Refill  . amoxicillin (AMOXIL) 500 MG capsule   Oral   Take 1 capsule (500 mg total) by mouth every 12 (twelve) hours. For infection         . FLUoxetine (PROZAC) 40 MG capsule   Oral   Take 1 capsule (40 mg total) by mouth daily. For depression   30 capsule   0   . hydrOXYzine (ATARAX/VISTARIL) 25 MG tablet   Oral   Take 1 tablet (25 mg total) by mouth every 6 (six) hours as needed for anxiety.   60 tablet   0   . mirtazapine (REMERON) 7.5 MG tablet   Oral   Take 1 tablet (7.5 mg total) by mouth at bedtime. For insomnia   30 tablet   0   . nicotine (NICODERM CQ - DOSED IN MG/24  HOURS) 21 mg/24hr patch   Transdermal   Place 1 patch (21 mg total) onto the skin daily. For smoking cessation   28 patch   0   . risperiDONE (RISPERDAL) 0.5 MG tablet   Oral   Take 1 tablet (0.5 mg total) by mouth 2 (two) times daily. For mood control   60 tablet   0     Allergies Review of patient's allergies indicates no known allergies.  History reviewed. No pertinent family history.  Social History Social History  Substance Use Topics  . Smoking status: Current Every Day Smoker -- 0.50 packs/day    Types: Cigarettes  . Smokeless tobacco: None  . Alcohol Use: Yes     Comment: 1 bottle of vodka every 3 days    Review of Systems Constitutional: No fever/chills. No lightheadedness or syncope. Eyes: No visual changes. No blurred or double vision. Positive photosensitivity. ENT: No sore throat. Cardiovascular: Denies chest pain, palpitations. Respiratory: Denies shortness of breath.  No cough. Gastrointestinal: No abdominal pain.  No nausea, no vomiting.  No diarrhea.  No constipation. Genitourinary: Negative for dysuria. Musculoskeletal: Negative for back pain. Skin: Negative for rash. Neurological: Positive for typical headache. No numbness tickling or weakness. No changes in gait or speech.  Psychiatric:Denies any SI, HI or hallucinations. 10-point ROS otherwise negative.  ____________________________________________   PHYSICAL EXAM:  VITAL  SIGNS: ED Triage Vitals  Enc Vitals Group     BP 01/20/16 2040 110/70 mmHg     Pulse Rate 01/20/16 2040 103     Resp --      Temp 01/20/16 2040 98.3 F (36.8 C)     Temp Source 01/20/16 2040 Oral     SpO2 01/20/16 2040 92 %     Weight 01/20/16 2040 135 lb (61.236 kg)     Height 01/20/16 2040  (1.676 m)     Head Cir --      Peak Flow --      Pain Score 01/20/16 2042 9     Pain Loc --      Pain Edu? --      Excl. in GC? --     Constitutional: Patient is alert and oriented and answering questions  appropriately. She has a bizarre affect but does have good insight into why she is here.  Eyes: Conjunctivae are normal.  EOMI. no scleral icterus. No eye discharge. Head: Atraumatic. Nose: No congestion/rhinnorhea. Mouth/Throat: Mucous membranes are dry. Patient has a white coating on the tongue suggestive of thrush. Neck: No stridor.  Supple.  No JVD. Cardiovascular: Fast rate, regular rhythm. No murmurs, rubs or gallops.  Respiratory: Normal respiratory effort.  No retractions. Lungs CTAB.  No wheezes, rales or ronchi. Gastrointestinal: Soft and nontender. No distention. No peritoneal signs. Musculoskeletal: No LE edema.  Neurologic:  Normal speech and language. No gross focal neurologic deficits are appreciated.  Skin:  Skin is warm, dry and intact. No rash noted. Psychiatric: Depressed mood and flat affect.  ____________________________________________   LABS (all labs ordered are listed, but only abnormal results are displayed)  Labs Reviewed  COMPREHENSIVE METABOLIC PANEL - Abnormal; Notable for the following:    Chloride 94 (*)    CO2 20 (*)    Calcium 8.7 (*)    AST 121 (*)    ALT 96 (*)    Anion gap 25 (*)    All other components within normal limits  ETHANOL - Abnormal; Notable for the following:    Alcohol, Ethyl (B) 359 (*)    All other components within normal limits  CBC  URINE DRUG SCREEN, QUALITATIVE ([facility] ONLY)  URINALYSIS COMPLETEWITH MICROSCOPIC ([facility] ONLY)   ____________________________________________  EKG  Not indicated  ____________________________________________  RADIOLOGY  Ct Head Wo Contrast  01/20/2016  CLINICAL DATA:  Headache.  Ethanol intoxication. EXAM: CT HEAD WITHOUT CONTRAST TECHNIQUE: Contiguous axial images were obtained from the base of the skull through the vertex without intravenous contrast. COMPARISON:  None. FINDINGS: The brainstem, cerebellum, cerebral peduncles, thalami, basal ganglia, basilar cisterns, and ventricular  system appear within normal limits. No intracranial hemorrhage, mass lesion, or acute CVA. Chronic bilateral maxillary, ethmoid, and sphenoid sinusitis. The left frontal sinus is essentially completely opacified, potentially from acute or chronic sinusitis. IMPRESSION: 1. No acute intracranial findings are identified. 2. Chronic bilateral maxillary, ethmoid, and sphenoid sinusitis with complete opacification of the left frontal sinus. Electronically Signed   By: Gaylyn Rong M.D.   On: 01/20/2016 21:26    ____________________________________________   PROCEDURES  Procedure(s) performed: None  Critical Care performed: No ____________________________________________   INITIAL IMPRESSION / ASSESSMENT AND PLAN / ED COURSE  Pertinent labs & imaging results that were available during my care of the patient were reviewed by me and considered in my medical decision making (see chart for details).  55 y.o. with a history of alcohol dependence and migraines presenting  with typical headache. Her headache has been similar in character and severity to previous headaches but has not responded to Tylenol or alcohol which usually treats her headaches. On my exam, she has no focal neurologic deficit but does appear potentially intoxicated. Because I cannot exclude alcohol or drug abuse, CT scan of her head as well as basic labs. We'll give her IV fluids as she does appear dry and continue to monitor her tachycardia.  ----------------------------------------- 11:15 PM on 01/20/2016 -----------------------------------------  The patient is feeling significantly better. Her headache has improved and her nausea is gone. She is tolerating liquids and crackers. The CT scan does not show any acute process: She has a chronic sinusitis which may contribute to her chronic headaches but there is no acute treatment indicated today. She has now alcohol level of greater than 300 but clinically he has clear speech.  We will road test her to make sure she is able to walk without instability. Anticipate discharge with close PMD follow-up.   ____________________________________________  FINAL CLINICAL IMPRESSION(S) / ED DIAGNOSES  Final diagnoses:  Sinusitis chronic, ethmoidal  Other migraine without status migrainosus, not intractable  Alcohol intoxication, uncomplicated (HCC)      NEW MEDICATIONS STARTED DURING THIS VISIT:  New Prescriptions   No medications on file     Rockne Menghini, MD 01/20/16 2321

## 2016-01-20 NOTE — ED Notes (Signed)
Pt moved to 19H at this time - This RN assumed care - She is resting quietly and responds appropriately to verbal and physical stimuli - She does appear to be under the influence of some substance at this time and reports taking Tyl PM and drinking vodka

## 2016-01-20 NOTE — Discharge Instructions (Signed)
Today the CT scan of your head showed chronic sinusitis. This may be contributing to your regular headaches. Please talk to your primary care physician about this.  Return to the emergency department if you develop severe pain, numbness tingling or weakness, changes in vision or speech, confusion, or any other symptoms concerning to you.  Alcohol Intoxication Alcohol intoxication occurs when you drink enough alcohol that it affects your ability to function. It can be mild or very severe. Drinking a lot of alcohol in a short time is called binge drinking. This can be very harmful. Drinking alcohol can also be more dangerous if you are taking medicines or other drugs. Some of the effects caused by alcohol may include:  Loss of coordination.  Changes in mood and behavior.  Unclear thinking.  Trouble talking (slurred speech).  Throwing up (vomiting).  Confusion.  Slowed breathing.  Twitching and shaking (seizures).  Loss of consciousness. HOME CARE  Do not drive after drinking alcohol.  Drink enough water and fluids to keep your pee (urine) clear or pale yellow. Avoid caffeine.  Only take medicine as told by your doctor. GET HELP IF:  You throw up (vomit) many times.  You do not feel better after a few days.  You frequently have alcohol intoxication. Your doctor can help decide if you should see a substance use treatment counselor. GET HELP RIGHT AWAY IF:  You become shaky when you stop drinking.  You have twitching and shaking.  You throw up blood. It may look bright red or like coffee grounds.  You notice blood in your poop (bowel movements).  You become lightheaded or pass out (faint). MAKE SURE YOU:   Understand these instructions.  Will watch your condition.  Will get help right away if you are not doing well or get worse.   This information is not intended to replace advice given to you by your health care provider. Make sure you discuss any questions you  have with your health care provider.   Document Released: 05/03/2008 Document Revised: 07/18/2013 Document Reviewed: 04/20/2013 Elsevier Interactive Patient Education [employer]

## 2016-01-20 NOTE — ED Notes (Signed)
Lab called alcohol level of 359 - reported to Dr Sharma Covert at this time

## 2016-01-20 NOTE — ED Notes (Signed)
Pt to ED via [facility] with HA at this time. Pt with ETOH on board. Upon arrival pt states "I changed my mind I don't want to be here" Per EMS pt took 4 tylenol PM along with 1 L of vodka. Upon arrival pt with ETOH on board, sating 88% on RA. Pt AAOx4, following commands.

## 2016-01-21 LAB — URINALYSIS COMPLETE WITH MICROSCOPIC (ARMC ONLY)
BACTERIA UA: NONE SEEN
Bilirubin Urine: NEGATIVE
Glucose, UA: NEGATIVE mg/dL
Leukocytes, UA: NEGATIVE
NITRITE: NEGATIVE
PH: 5 (ref 5.0–8.0)
Specific Gravity, Urine: 1.025 (ref 1.005–1.030)

## 2016-01-21 LAB — URINE DRUG SCREEN, QUALITATIVE (ARMC ONLY)
Amphetamines, Ur Screen: NOT DETECTED
Barbiturates, Ur Screen: NOT DETECTED
Benzodiazepine, Ur Scrn: NOT DETECTED
CANNABINOID 50 NG, UR ~~LOC~~: NOT DETECTED
COCAINE METABOLITE, UR ~~LOC~~: NOT DETECTED
MDMA (ECSTASY) UR SCREEN: NOT DETECTED
Methadone Scn, Ur: NOT DETECTED
OPIATE, UR SCREEN: NOT DETECTED
PHENCYCLIDINE (PCP) UR S: NOT DETECTED
Tricyclic, Ur Screen: POSITIVE — AB

## 2016-01-21 MED ORDER — IBUPROFEN 800 MG PO TABS
800.0000 mg | ORAL_TABLET | Freq: Once | ORAL | Status: AC
  Administered 2016-01-21: 800 mg via ORAL
  Filled 2016-01-21: qty 1

## 2016-01-21 NOTE — ED Provider Notes (Signed)
-----------------------------------------   6:10 AM on 01/21/2016 -----------------------------------------  Patient remained in the emergency department overnight secondary to lack of transportation home. She will be discharged this morning once she is able to find a ride.  Irean Hong, MD 01/21/16 [phone]

## 2016-02-16 ENCOUNTER — Emergency Department
Admission: EM | Admit: 2016-02-16 | Discharge: 2016-02-17 | Disposition: A | Discharge status: Other Acute Inpatient Hospital | Payer: Medicaid Other | Attending: Emergency Medicine | Admitting: Emergency Medicine

## 2016-02-16 DIAGNOSIS — Z79899 Other long term (current) drug therapy: Secondary | ICD-10-CM | POA: Insufficient documentation

## 2016-02-16 DIAGNOSIS — M1990 Unspecified osteoarthritis, unspecified site: Secondary | ICD-10-CM | POA: Diagnosis present

## 2016-02-16 DIAGNOSIS — G8929 Other chronic pain: Secondary | ICD-10-CM | POA: Insufficient documentation

## 2016-02-16 DIAGNOSIS — F10239 Alcohol dependence with withdrawal, unspecified: Secondary | ICD-10-CM | POA: Diagnosis present

## 2016-02-16 DIAGNOSIS — M25552 Pain in left hip: Secondary | ICD-10-CM | POA: Insufficient documentation

## 2016-02-16 DIAGNOSIS — F1010 Alcohol abuse, uncomplicated: Secondary | ICD-10-CM

## 2016-02-16 DIAGNOSIS — F10939 Alcohol use, unspecified with withdrawal, unspecified: Secondary | ICD-10-CM | POA: Diagnosis present

## 2016-02-16 DIAGNOSIS — F1020 Alcohol dependence, uncomplicated: Secondary | ICD-10-CM | POA: Diagnosis present

## 2016-02-16 DIAGNOSIS — F17210 Nicotine dependence, cigarettes, uncomplicated: Secondary | ICD-10-CM | POA: Insufficient documentation

## 2016-02-16 DIAGNOSIS — F1994 Other psychoactive substance use, unspecified with psychoactive substance-induced mood disorder: Secondary | ICD-10-CM | POA: Diagnosis present

## 2016-02-16 DIAGNOSIS — F329 Major depressive disorder, single episode, unspecified: Secondary | ICD-10-CM | POA: Insufficient documentation

## 2016-02-16 DIAGNOSIS — F10129 Alcohol abuse with intoxication, unspecified: Secondary | ICD-10-CM | POA: Insufficient documentation

## 2016-02-16 DIAGNOSIS — Z792 Long term (current) use of antibiotics: Secondary | ICD-10-CM | POA: Insufficient documentation

## 2016-02-16 DIAGNOSIS — F32A Depression, unspecified: Secondary | ICD-10-CM

## 2016-02-16 LAB — URINE DRUG SCREEN, QUALITATIVE (ARMC ONLY)
AMPHETAMINES, UR SCREEN: NOT DETECTED
BENZODIAZEPINE, UR SCRN: NOT DETECTED
Barbiturates, Ur Screen: NOT DETECTED
Cannabinoid 50 Ng, Ur ~~LOC~~: NOT DETECTED
Cocaine Metabolite,Ur ~~LOC~~: NOT DETECTED
MDMA (ECSTASY) UR SCREEN: NOT DETECTED
METHADONE SCREEN, URINE: NOT DETECTED
OPIATE, UR SCREEN: NOT DETECTED
PHENCYCLIDINE (PCP) UR S: NOT DETECTED
Tricyclic, Ur Screen: NOT DETECTED

## 2016-02-16 LAB — COMPREHENSIVE METABOLIC PANEL
ALBUMIN: 3.9 g/dL (ref 3.5–5.0)
ALK PHOS: 97 U/L (ref 38–126)
ALT: 151 U/L — AB (ref 14–54)
AST: 244 U/L — AB (ref 15–41)
Anion gap: 20 — ABNORMAL HIGH (ref 5–15)
BUN: 9 mg/dL (ref 6–20)
CALCIUM: 8.6 mg/dL — AB (ref 8.9–10.3)
CHLORIDE: 92 mmol/L — AB (ref 101–111)
CO2: 23 mmol/L (ref 22–32)
CREATININE: 0.57 mg/dL (ref 0.44–1.00)
GFR calc Af Amer: >60 mL/min (ref >60)
GFR calc non Af Amer: >60 mL/min (ref >60)
GLUCOSE: 104 mg/dL — AB (ref 65–99)
Potassium: 3.8 mmol/L (ref 3.5–5.1)
SODIUM: 135 mmol/L (ref 135–145)
Total Bilirubin: 1.3 mg/dL — ABNORMAL HIGH (ref 0.3–1.2)
Total Protein: 6.7 g/dL (ref 6.5–8.1)

## 2016-02-16 LAB — CBC
HCT: 41.7 % (ref 35.0–47.0)
HEMOGLOBIN: 14.1 g/dL (ref 12.0–16.0)
MCH: 30.8 pg (ref 26.0–34.0)
MCHC: 33.7 g/dL (ref 32.0–36.0)
MCV: 91.3 fL (ref 80.0–100.0)
PLATELETS: 170 10*3/uL (ref 150–440)
RBC: 4.57 MIL/uL (ref 3.80–5.20)
RDW: 15.4 % — ABNORMAL HIGH (ref 11.5–14.5)
WBC: 4.2 10*3/uL (ref 3.6–11.0)

## 2016-02-16 LAB — BLOOD GAS, VENOUS
ACID-BASE EXCESS: 3.9 mmol/L — AB (ref 0.0–3.0)
Bicarbonate: 27.7 mEq/L (ref 21.0–28.0)
FIO2: 0.21
O2 SAT: 78.9 %
PCO2 VEN: 38 mmHg — AB (ref 44.0–60.0)
PH VEN: 7.47 — AB (ref 7.320–7.430)
PO2 VEN: 40 mmHg (ref 31.0–45.0)
Patient temperature: 37

## 2016-02-16 LAB — SALICYLATE LEVEL: Salicylate Lvl: <4 mg/dL (ref 2.8–30.0)

## 2016-02-16 LAB — ACETAMINOPHEN LEVEL: Acetaminophen (Tylenol), Serum: <10 ug/mL — ABNORMAL LOW (ref 10–30)

## 2016-02-16 LAB — ETHANOL: Alcohol, Ethyl (B): 336 mg/dL (ref <5)

## 2016-02-16 MED ORDER — THIAMINE HCL 100 MG/ML IJ SOLN
100.0000 mg | Freq: Once | INTRAMUSCULAR | Status: AC
  Administered 2016-02-16: 100 mg via INTRAVENOUS
  Filled 2016-02-16: qty 2

## 2016-02-16 MED ORDER — DEXTROSE-NACL 5-0.9 % IV SOLN
INTRAVENOUS | Status: DC
  Administered 2016-02-16: 23:00:00 via INTRAVENOUS

## 2016-02-16 NOTE — ED Notes (Signed)
Per EMS, patient comes from home with ETOH intox. Patient drank a fifth of vodka "because by boyfriend broke up with me". Patient denies any other substance/drug abuse. Patient states her left hip is hurting as well. Patient is A&O x4. Patient denies drinking daily. Denies hx of seizures from drinking.

## 2016-02-16 NOTE — ED Notes (Signed)
Pt ambulatory to toilet, changed bed sheets and gave pt warm blanket

## 2016-02-16 NOTE — ED Provider Notes (Signed)
[facility] Emergency Department Provider Note  Time seen: 8:27 PM  I have reviewed the triage vital signs and the nursing notes.   HISTORY  Chief Complaint Alcohol Intoxication    HPI Amanda Huff is a 55 y.o. female with a past medical history of alcohol abuse and arthritis presents to the emergency department with depression and alcohol abuse. According to the patient sees states her boyfriend broke up with her so she drank a fifth of vodka tonight. States she has been very depressed, but denies any SI or HI. States she wants to talk to a psychiatrist. Patient's only medical complaint is left hip pain which she states is chronic due to arthritis and she has been told she needs a hip replacement. Patient denies nausea or vomiting. States her last drink was just before arrival. States she forgot her purse so she can't take a cab home.Denies having any friends or family in the area.     Past Medical History  Diagnosis Date  . ETOH abuse   . Arthritis     Patient Active Problem List   Diagnosis Date Noted  . Alcohol use disorder, severe, dependence (HCC) 11/11/2015  . Alcohol abuse with alcohol-induced mood disorder (HCC) 10/29/2015  . Depression, major, recurrent, moderate (HCC) 10/29/2015    Past Surgical History  Procedure Laterality Date  . Cesarean section      Current Outpatient Rx  Name  Route  Sig  Dispense  Refill  . amoxicillin (AMOXIL) 500 MG capsule   Oral   Take 1 capsule (500 mg total) by mouth every 12 (twelve) hours. For infection         . FLUoxetine (PROZAC) 40 MG capsule   Oral   Take 1 capsule (40 mg total) by mouth daily. For depression   30 capsule   0   . hydrOXYzine (ATARAX/VISTARIL) 25 MG tablet   Oral   Take 1 tablet (25 mg total) by mouth every 6 (six) hours as needed for anxiety.   60 tablet   0   . mirtazapine (REMERON) 7.5 MG tablet   Oral   Take 1 tablet (7.5 mg total) by mouth at bedtime. For insomnia  30 tablet   0   . nicotine (NICODERM CQ - DOSED IN MG/24 HOURS) 21 mg/24hr patch   Transdermal   Place 1 patch (21 mg total) onto the skin daily. For smoking cessation   28 patch   0   . risperiDONE (RISPERDAL) 0.5 MG tablet   Oral   Take 1 tablet (0.5 mg total) by mouth 2 (two) times daily. For mood control   60 tablet   0     Allergies Review of patient's allergies indicates no known allergies.  No family history on file.  Social History Social History  Substance Use Topics  . Smoking status: Current Every Day Smoker -- 0.50 packs/day    Types: Cigarettes  . Smokeless tobacco: Not on file  . Alcohol Use: Yes     Comment: 1 bottle of vodka every 3 days    Review of Systems Constitutional: Negative for fever Cardiovascular: Negative for chest pain. Respiratory: Negative for shortness of breath. Gastrointestinal: Negative for abdominal pain. Negative for nausea or vomiting Neurological: Negative for headache 10-point ROS otherwise negative.  ____________________________________________   PHYSICAL EXAM:  VITAL SIGNS: ED Triage Vitals  Enc Vitals Group     BP 02/16/16 2014 128/99 mmHg     Pulse Rate 02/16/16 2014 111  Resp 02/16/16 2014 17     Temp 02/16/16 2014 99.2 F (37.3 C)     Temp src --      SpO2 02/16/16 2014 96 %     Weight 02/16/16 2014 123 lb (55.792 kg)     Height 02/16/16 2014  (1.676 m)     Head Cir --      Peak Flow --      Pain Score 02/16/16 2018 8     Pain Loc --      Pain Edu? --      Excl. in GC? --     Constitutional: Alert and oriented. Slurred speech, appears intoxicated. Eyes: Normal exam ENT   Head: Normocephalic and atraumatic.   Mouth/Throat: Mucous membranes are moist. Cardiovascular: Regular rhythm and rate gram 100 bpm. No murmur. Respiratory: Normal respiratory effort without tachypnea nor retractions. Breath sounds are clear Gastrointestinal: Soft and nontender. No distention. Musculoskeletal:  Nontender with normal range of motion in all extremities.  Neurologic:  Slurred speech. No gross deficits move all extremities Skin:  Skin is warm, dry and intact.  Psychiatric: Slurred speech, admits to depression but denies any SI or HI. ____________________________________________    INITIAL IMPRESSION / ASSESSMENT AND PLAN / ED COURSE  Pertinent labs & imaging results that were available during my care of the patient were reviewed by me and considered in my medical decision making (see chart for details).  Patient presents the emergency department with depression and alcohol use. States he drank a fifth of vodka because her boyfriend broke up with her. States she wants to talk to a psychiatrist. Denies any medical complaints besides chronic left hip pain but denies any acute worsening. We will have psychiatry see the patient. We'll check labs, I anticipate an elevated blood alcohol level. We will allow the patient to metabolize her alcohol in the emergency department until she can be evaluated by psychiatry. The patient is currently here voluntarily, she does not meet involuntary commitment criteria unless the patient were to try to leave all actively intoxicated without someone to care for her.  Labs show an elevated alcohol level of 336. Anion gap of 20 suggesting possible alcoholic ketoacidosis. Glucose is normal. We will IV hydrate the patient in the emergency department and dose IV thiamine. I will check a VBG as well as urine ketones.  VBG shows a pH is 7.47. Patient has been dose thiamine. Receiving IV fluids. We will continue to monitor in the emergency department. Patient wishes to talk to psychiatry, TTS has seen the patient states psychiatry will see the patient once more sober.   ____________________________________________   FINAL CLINICAL IMPRESSION(S) / ED DIAGNOSES  Alcohol abuse Depression   Minna Antis, MD 02/16/16 [phone]

## 2016-02-17 ENCOUNTER — Observation Stay (HOSPITAL_COMMUNITY)
Admission: AD | Admit: 2016-02-17 | Discharge: 2016-02-18 | Disposition: A | Discharge status: Home / Self Care | Payer: Federal, State, Local not specified - Other | Source: Intra-hospital | Attending: Psychiatry | Admitting: Psychiatry

## 2016-02-17 ENCOUNTER — Encounter (HOSPITAL_COMMUNITY): Payer: Self-pay

## 2016-02-17 ENCOUNTER — Emergency Department: Payer: Self-pay

## 2016-02-17 DIAGNOSIS — F10239 Alcohol dependence with withdrawal, unspecified: Secondary | ICD-10-CM | POA: Diagnosis present

## 2016-02-17 DIAGNOSIS — F1014 Alcohol abuse with alcohol-induced mood disorder: Principal | ICD-10-CM | POA: Insufficient documentation

## 2016-02-17 DIAGNOSIS — M1990 Unspecified osteoarthritis, unspecified site: Secondary | ICD-10-CM | POA: Diagnosis present

## 2016-02-17 DIAGNOSIS — F10939 Alcohol use, unspecified with withdrawal, unspecified: Secondary | ICD-10-CM | POA: Diagnosis present

## 2016-02-17 DIAGNOSIS — F1994 Other psychoactive substance use, unspecified with psychoactive substance-induced mood disorder: Secondary | ICD-10-CM | POA: Diagnosis not present

## 2016-02-17 DIAGNOSIS — F10188 Alcohol abuse with other alcohol-induced disorder: Secondary | ICD-10-CM | POA: Diagnosis present

## 2016-02-17 DIAGNOSIS — F332 Major depressive disorder, recurrent severe without psychotic features: Secondary | ICD-10-CM | POA: Diagnosis present

## 2016-02-17 DIAGNOSIS — F4321 Adjustment disorder with depressed mood: Secondary | ICD-10-CM

## 2016-02-17 DIAGNOSIS — F10988 Alcohol use, unspecified with other alcohol-induced disorder: Secondary | ICD-10-CM

## 2016-02-17 LAB — URINALYSIS COMPLETE WITH MICROSCOPIC (ARMC ONLY)
BILIRUBIN URINE: NEGATIVE
GLUCOSE, UA: 50 mg/dL — AB
LEUKOCYTES UA: NEGATIVE
Nitrite: NEGATIVE
Protein, ur: >500 mg/dL — AB
SPECIFIC GRAVITY, URINE: 1.023 (ref 1.005–1.030)
pH: 5 (ref 5.0–8.0)

## 2016-02-17 LAB — ETHANOL: Alcohol, Ethyl (B): <5 mg/dL (ref <5)

## 2016-02-17 MED ORDER — SODIUM CHLORIDE 0.9 % IV BOLUS (SEPSIS)
1000.0000 mL | Freq: Once | INTRAVENOUS | Status: AC
  Administered 2016-02-17: 1000 mL via INTRAVENOUS

## 2016-02-17 MED ORDER — LORAZEPAM 2 MG/ML IJ SOLN: 1.0000 mg | Freq: Once | INTRAMUSCULAR | Status: DC

## 2016-02-17 MED ORDER — LORAZEPAM 2 MG/ML IJ SOLN: 0.0000 mg | Freq: Two times a day (BID) | INTRAMUSCULAR | Status: DC

## 2016-02-17 MED ORDER — LORAZEPAM 1 MG PO TABS: 1.0000 mg | ORAL_TABLET | Freq: Two times a day (BID) | ORAL | Status: DC

## 2016-02-17 MED ORDER — LORAZEPAM 1 MG PO TABS: 1.0000 mg | ORAL_TABLET | Freq: Three times a day (TID) | ORAL | Status: DC

## 2016-02-17 MED ORDER — MAGNESIUM HYDROXIDE 400 MG/5ML PO SUSP: 30.0000 mL | Freq: Every day | ORAL | Status: DC | PRN

## 2016-02-17 MED ORDER — FLUOXETINE HCL 20 MG PO CAPS
20.0000 mg | ORAL_CAPSULE | Freq: Every day | ORAL | Status: DC
  Administered 2016-02-18: 20 mg via ORAL
  Filled 2016-02-17: qty 1

## 2016-02-17 MED ORDER — LORAZEPAM 1 MG PO TABS
1.0000 mg | ORAL_TABLET | Freq: Four times a day (QID) | ORAL | Status: DC
  Administered 2016-02-17 – 2016-02-18 (×3): 1 mg via ORAL
  Filled 2016-02-17 (×3): qty 1

## 2016-02-17 MED ORDER — LORAZEPAM 2 MG/ML IJ SOLN
0.0000 mg | Freq: Four times a day (QID) | INTRAMUSCULAR | Status: DC
  Administered 2016-02-17: 1 mg via INTRAVENOUS
  Administered 2016-02-17: 2 mg via INTRAVENOUS
  Administered 2016-02-17: 1 mg via INTRAVENOUS
  Filled 2016-02-17: qty 2

## 2016-02-17 MED ORDER — ACETAMINOPHEN 325 MG PO TABS: 650.0000 mg | ORAL_TABLET | Freq: Four times a day (QID) | ORAL | Status: DC | PRN

## 2016-02-17 MED ORDER — TRAZODONE HCL 50 MG PO TABS
50.0000 mg | ORAL_TABLET | Freq: Every evening | ORAL | Status: DC | PRN
  Administered 2016-02-17: 50 mg via ORAL
  Filled 2016-02-17: qty 1

## 2016-02-17 MED ORDER — LORAZEPAM 2 MG/ML IJ SOLN
INTRAMUSCULAR | Status: AC
  Administered 2016-02-17: 1 mg via INTRAVENOUS
  Filled 2016-02-17: qty 1

## 2016-02-17 MED ORDER — LORAZEPAM 1 MG PO TABS: 1.0000 mg | ORAL_TABLET | Freq: Four times a day (QID) | ORAL | Status: DC | PRN

## 2016-02-17 MED ORDER — HYDROXYZINE HCL 25 MG PO TABS: 25.0000 mg | ORAL_TABLET | Freq: Four times a day (QID) | ORAL | Status: DC | PRN

## 2016-02-17 MED ORDER — LORAZEPAM 2 MG/ML IJ SOLN
INTRAMUSCULAR | Status: AC
  Filled 2016-02-17: qty 1

## 2016-02-17 MED ORDER — VITAMIN B-1 100 MG PO TABS
100.0000 mg | ORAL_TABLET | Freq: Every day | ORAL | Status: DC
  Administered 2016-02-18: 100 mg via ORAL
  Filled 2016-02-17: qty 1

## 2016-02-17 MED ORDER — LOPERAMIDE HCL 2 MG PO CAPS: 2.0000 mg | ORAL_CAPSULE | ORAL | Status: DC | PRN

## 2016-02-17 MED ORDER — ALUM & MAG HYDROXIDE-SIMETH 200-200-20 MG/5ML PO SUSP: 30.0000 mL | ORAL | Status: DC | PRN

## 2016-02-17 MED ORDER — RISPERIDONE 0.5 MG PO TABS
0.5000 mg | ORAL_TABLET | Freq: Two times a day (BID) | ORAL | Status: DC
  Administered 2016-02-17 – 2016-02-18 (×2): 0.5 mg via ORAL
  Filled 2016-02-17 (×2): qty 1

## 2016-02-17 MED ORDER — ADULT MULTIVITAMIN W/MINERALS CH
1.0000 | ORAL_TABLET | Freq: Every day | ORAL | Status: DC
  Administered 2016-02-18: 1 via ORAL
  Filled 2016-02-17: qty 1

## 2016-02-17 MED ORDER — LORAZEPAM 1 MG PO TABS: 1.0000 mg | ORAL_TABLET | Freq: Every day | ORAL | Status: DC

## 2016-02-17 MED ORDER — LORAZEPAM 2 MG/ML IJ SOLN
1.0000 mg | Freq: Once | INTRAMUSCULAR | Status: AC
  Administered 2016-02-17: 1 mg via INTRAVENOUS
  Filled 2016-02-17: qty 1

## 2016-02-17 MED ORDER — THIAMINE HCL 100 MG/ML IJ SOLN: 100.0000 mg | Freq: Once | INTRAMUSCULAR | Status: DC

## 2016-02-17 MED ORDER — ONDANSETRON 4 MG PO TBDP: 4.0000 mg | ORAL_TABLET | Freq: Four times a day (QID) | ORAL | Status: DC | PRN

## 2016-02-17 NOTE — ED Notes (Signed)
Called for transport by [id number]

## 2016-02-17 NOTE — ED Notes (Signed)
BEHAVIORAL HEALTH ROUNDING Patient sleeping: No. Patient alert and oriented: yes Behavior appropriate: Yes.  ; If no, describe:  Nutrition and fluids offered: Yes  Toileting and hygiene offered: Yes  Sitter present: yes Law enforcement present: Yes  
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
## 2016-02-17 NOTE — ED Provider Notes (Signed)
-----------------------------------------   12:09 PM on 02/17/2016 -----------------------------------------  Patient resting comfortably and while I'm in the room and the heart rate is down to 94 bpm. She denies any chest pain or shortness of breath. Likely dehydrated secondary to alcohol.  Blood pressure 139/84, pulse 103, temperature 99.2 F (37.3 C), resp. rate 19, height 5\' 6"  (1.676 m), weight 123 lb (55.792 kg), SpO2 96 %.  The patient had no acute events since last update.  Calm and cooperative at this time.  Disposition is pending per Psychiatry/Behavioral Medicine team recommendations.     Myrna Blazeravid Matthew Jazel Nimmons, MD 02/17/16 1210

## 2016-02-17 NOTE — ED Notes (Signed)
BEHAVIORAL HEALTH ROUNDING Patient sleeping: Yes.   Patient alert and oriented: not applicable Behavior appropriate: Yes.  ; If no, describe:  Nutrition and fluids offered: No Toileting and hygiene offered: No Sitter present: yes Law enforcement present: Yes   ENVIRONMENTAL ASSESSMENT Potentially harmful objects out of patient reach: Yes.   Personal belongings secured: Yes.   Patient dressed in hospital provided attire only: Yes.   Plastic bags out of patient reach: Yes.   Patient care equipment (cords, cables, call bells, lines, and drains) shortened, removed, or accounted for: No. Equipment and supplies removed from bottom of stretcher: Yes.   Potentially toxic materials out of patient reach: Yes.   Sharps container removed or out of patient reach: Yes.

## 2016-02-17 NOTE — Consult Note (Signed)
  Psychiatry: Follow-up on plan for this patient. Spoke with triage specialist at [facility]. Patient can be admitted to observation bed there for brief detox. She is not acutely dangerous and not under commitment and is agreeable to the plan. Only criteria is that her blood alcohol level be down under 200. I expect that we'll probably be the case at this point. I've gone ahead and ordered a stat blood alcohol level. If that comes back under 200 nursing here can initiate plans to transfer her.
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
## 2016-02-17 NOTE — ED Provider Notes (Addendum)
-----------------------------------------   12:53 AM on 02/17/2016 -----------------------------------------  Patient is mild tachycardic, her heart rate is 102 when I'm in the room. She is very upset and anxious. She states that she can't sleep and she has insomnia and she is worried about her boyfriend. She was signed out to me essentially intoxicated at otherwise medically cleared, she has no history of withdrawal and she is not in withdrawal at this time. She has no tremulousness, she is agitated however. Even though her alcohol level as hiatal thing for her is markedly elevated given how much she states she drinks. I will give her 1 mg of Ativan to see if he can calm her nerves down and allow her to sleep. Patient has expressed a desire to stay and talk to her psychiatrist. We can arrange that for her but at this point she does not meet criteria for acute involuntary commitment given that she has no SI or HI area and we will continue to observe her closely in the emergency department. I'm giving her IV fluids as well.  Jeanmarie PlantJames A McShane, MD 02/17/16 0055  ----------------------------------------- 7:28 AM on 02/17/2016 -----------------------------------------  Patient is asleep at this time, slight tachycardia noted no other complaints or issues. Not tremulous. Signed out at the end of my shift. Dr. Dierdre SearlesShevitz.   Jeanmarie PlantJames A McShane, MD 02/17/16 [phone]

## 2016-02-17 NOTE — ED Notes (Signed)
Pt given breakfast tray

## 2016-02-17 NOTE — ED Notes (Signed)
Pt resting.

## 2016-02-17 NOTE — BH Assessment (Signed)
Per Dr. Toni Amendlapacs, patient meets criteria for OBS Dept.  Per Alcario Droughtanika, NP - patient can come to the [place] after the patients BAL is below 200.  The nurse will need to contact [facility].  Writer will fax the support paper work for the patient to sign [facility].  The nurse will fax the signed voluntary form to [phone].  Writer informed the Press photographerCharge Nurse Arville Lime(Kacy) of the disposition.

## 2016-02-17 NOTE — ED Notes (Signed)
Patient placed on 2L Oxygen nasal cannula

## 2016-02-17 NOTE — ED Notes (Signed)
Called for transport by [facility]

## 2016-02-18 DIAGNOSIS — F4321 Adjustment disorder with depressed mood: Secondary | ICD-10-CM

## 2016-02-18 DIAGNOSIS — F332 Major depressive disorder, recurrent severe without psychotic features: Secondary | ICD-10-CM | POA: Diagnosis present

## 2016-02-18 MED ORDER — RISPERIDONE 0.5 MG PO TABS: 0.5000 mg | ORAL_TABLET | Freq: Two times a day (BID) | ORAL | Status: AC

## 2016-02-18 MED ORDER — TRAZODONE HCL 50 MG PO TABS: 50.0000 mg | ORAL_TABLET | Freq: Every evening | ORAL | Status: AC | PRN

## 2016-02-18 MED ORDER — MIRTAZAPINE 7.5 MG PO TABS: 7.5000 mg | ORAL_TABLET | Freq: Every day | ORAL | Status: AC

## 2016-02-18 MED ORDER — HYDROXYZINE HCL 25 MG PO TABS: 25.0000 mg | ORAL_TABLET | Freq: Four times a day (QID) | ORAL | Status: AC | PRN
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
## 2016-02-18 NOTE — H&P (Signed)
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 Adjustment disorder with depressed mood [F43.21]   . Substance induced mood disorder ([place]) [F19.94] 02/17/2016  . Alcohol withdrawal ([place]) [F10.239] 02/17/2016  . Arthritis [M19.90] 02/17/2016  . Alcohol abuse with alcohol-induced mental disorder ([place]) [F10.988] 02/17/2016  . Alcohol use disorder, severe, dependence ([place]) [F10.20] 11/11/2015  . Alcohol abuse with alcohol-induced mood disorder ([place]) [F10.14] 10/29/2015  . Depression, major, recurrent, moderate ([place]) [F33.1] 10/29/2015   History of Present Illness: Patient is a 55 y/o female with long standing history of Alcohol abuse, presenting to [facility] ED acutely intoxicated as she reportedly drank 750 ML of Vodka. Patient has a prior history of ETOH abuse but yesterdays reported consumption is more than usual. Review of prior records per EPIC reveal the following, Patient says she has been treated for depression in the past and remembers having taken Prozac before but doesn't think it was ever helpful. She denies ever trying to kill her self. She says she's had one psychiatric hospitalization in the past for depression and she may be referring to the hospitalization just this past December at [facility] behavioral health. Denies ever having tried to harm herself in the past. Denies any history of mania or psychosis.  Depression Symptoms:  depressed mood, hopelessness, (Hypo) Manic Symptoms:  denies Anxiety Symptoms:  Excessive Worry, Psychotic Symptoms:  denies PTSD Symptoms: Negative Total Time spent with patient: 30 minutes  Past Psychiatric History: ( See HPI)  Is the patient at risk to self? No.  Has the patient been  a risk to self in the past 6 months? No.  Has the patient been a risk to self within the distant past? No.  Is the patient a risk to others? No.  Has the patient been a risk to others in the past 6 months? No.  Has the patient been a risk to others within the distant past? No.   Prior Inpatient Therapy:   Prior Outpatient Therapy:    Alcohol Screening: Patient refused Alcohol Screening Tool: Yes 1. How often do you have a drink containing alcohol?: 4 or more times a week 2. How many drinks containing alcohol do you have on a typical day when you are drinking?: 5 or 6 3. How often do you have six or more drinks on one occasion?: Never Preliminary Score: 2 7. How often during the last year have you had a feeling of guilt of remorse after drinking?: Monthly 8. How often during the last year have you been unable to remember what happened the night before because you had been drinking?: Monthly 9. Have you or someone else been injured as a result of your drinking?: No 10. Has a relative or friend or a doctor or another health worker been concerned about your drinking or suggested you cut down?: Yes, but not in the last year Alcohol Use Disorder Identification Test Final Score (AUDIT): 12 Brief Intervention: Patient declined brief intervention Substance Abuse History in the last 12 months:  Yes.   Consequences of Substance Abuse: Medical Consequences:  failing health Previous Psychotropic Medications: Yes  Psychological Evaluations: Yes  Past Medical History:  Past Medical History  Diagnosis Date  . ETOH abuse   . Arthritis     Past Surgical History  Procedure Laterality Date  . Cesarean section     Family History: History reviewed. No pertinent family history. Family Psychiatric  History: unremarkable Tobacco Screening: 1/2 PPD smoker Social History: Single History  Alcohol Use  . Yes    Comment: 1 bottle of vodka every 3 days     History  Drug Use No    Additional Social  History:      Pain Medications: See PTA medication  Prescriptions: See PTA medication list Over the Counter: See PTA medication list History of alcohol / drug use?: Yes Longest period of sobriety (when/how long): Unknown Negative Consequences of Use: Personal relationships Withdrawal Symptoms: Agitation, Sweats, Nausea / Vomiting, Irritability, Tremors Name of Substance 1: Alcohol 1 - Age of First Use: 16 1 - Amount (size/oz): about a bottle of vodka a week 1 - Frequency: 3 days a week 1 - Last Use / Amount: 02/16/2016                  Allergies:  No Known Allergies Lab Results:  Results for orders placed or performed during the hospital encounter of 02/16/16 (from the past 48 hour(s))  CBC     Status: Abnormal   Collection Time: 02/16/16  8:23 PM  Result Value Ref Range   WBC 4.2 3.6 - 11.0 K/uL   RBC 4.57 3.80 - 5.20 MIL/uL   Hemoglobin 14.1 12.0 - 16.0 g/dL   HCT 41.7 35.0 - 47.0 %   MCV 91.3 80.0 - 100.0 fL   MCH 30.8 26.0 - 34.0 pg   MCHC 33.7 32.0 - 36.0 g/dL   RDW 15.4 (H) 11.5 - 14.5 %   Platelets 170 150 - 440 K/uL  Comprehensive metabolic panel     Status: Abnormal   Collection Time: 02/16/16  8:23 PM  Result Value Ref Range   Sodium 135 135 - 145 mmol/L    Comment: LYTES REPEATED   Potassium 3.8 3.5 - 5.1 mmol/L   Chloride 92 (L) 101 - 111 mmol/L   CO2 23 22 - 32 mmol/L   Glucose, Bld 104 (H) 65 - 99 mg/dL   BUN 9 6 - 20 mg/dL   Creatinine, Ser 0.57 0.44 - 1.00 mg/dL   Calcium 8.6 (L) 8.9 - 10.3 mg/dL   Total Protein 6.7 6.5 - 8.1 g/dL   Albumin 3.9 3.5 - 5.0 g/dL   AST 244 (H) 15 - 41 U/L   ALT 151 (H) 14 - 54 U/L   Alkaline Phosphatase 97 38 - 126 U/L   Total Bilirubin 1.3 (H) 0.3 - 1.2 mg/dL   GFR calc non Af Amer >60 >60 mL/min   GFR calc Af Amer >60 >60 mL/min    Comment: (NOTE) The eGFR has been calculated using the CKD EPI equation. This calculation has not been validated in all clinical situations. eGFR's persistently <60 mL/min signify  possible Chronic Kidney Disease.    Anion gap 20 (H) 5 - 15  Ethanol     Status: Abnormal   Collection Time: 02/16/16  8:23 PM  Result Value Ref Range   Alcohol, Ethyl (B) 336 (HH) <5 mg/dL    Comment: CRITICAL RESULT CALLED TO, READ BACK BY AND VERIFIED WITH EMMA HUNTER AT 2126 02/16/16 MLZ        LOWEST DETECTABLE LIMIT FOR SERUM ALCOHOL IS 5 mg/dL FOR MEDICAL PURPOSES ONLY   Acetaminophen level     Status: Abnormal   Collection Time: 02/16/16  8:23 PM  Result Value Ref Range  Acetaminophen (Tylenol), Serum <10 (L) 10 - 30 ug/mL    Comment:        THERAPEUTIC CONCENTRATIONS VARY SIGNIFICANTLY. A RANGE OF 10-30 ug/mL MAY BE AN EFFECTIVE CONCENTRATION FOR MANY PATIENTS. HOWEVER, SOME ARE BEST TREATED AT CONCENTRATIONS OUTSIDE THIS RANGE. ACETAMINOPHEN CONCENTRATIONS >150 ug/mL AT 4 HOURS AFTER INGESTION AND >50 ug/mL AT 12 HOURS AFTER INGESTION ARE OFTEN ASSOCIATED WITH TOXIC REACTIONS.   Salicylate level     Status: None   Collection Time: 02/16/16  8:23 PM  Result Value Ref Range   Salicylate Lvl <8.7 2.8 - 30.0 mg/dL  Urine Drug Screen, Qualitative ([facility] only)     Status: None   Collection Time: 02/16/16  8:23 PM  Result Value Ref Range   Tricyclic, Ur Screen NONE DETECTED NONE DETECTED   Amphetamines, Ur Screen NONE DETECTED NONE DETECTED   MDMA (Ecstasy)Ur Screen NONE DETECTED NONE DETECTED   Cocaine Metabolite,Ur [place] NONE DETECTED NONE DETECTED   Opiate, Ur Screen NONE DETECTED NONE DETECTED   Phencyclidine (PCP) Ur S NONE DETECTED NONE DETECTED   Cannabinoid 50 Ng, Ur [place] NONE DETECTED NONE DETECTED   Barbiturates, Ur Screen NONE DETECTED NONE DETECTED   Benzodiazepine, Ur Scrn NONE DETECTED NONE DETECTED   Methadone Scn, Ur NONE DETECTED NONE DETECTED    Comment: (NOTE) 681  Tricyclics, urine               Cutoff 1000 ng/mL 200  Amphetamines, urine             Cutoff 1000 ng/mL 300  MDMA (Ecstasy), urine           Cutoff 500 ng/mL 400  Cocaine Metabolite,  urine       Cutoff 300 ng/mL 500  Opiate, urine                   Cutoff 300 ng/mL 600  Phencyclidine (PCP), urine      Cutoff 25 ng/mL 700  Cannabinoid, urine              Cutoff 50 ng/mL 800  Barbiturates, urine             Cutoff 200 ng/mL 900  Benzodiazepine, urine           Cutoff 200 ng/mL 1000 Methadone, urine                Cutoff 300 ng/mL 1100 1200 The urine drug screen provides only a preliminary, unconfirmed 1300 analytical test result and should not be used for non-medical 1400 purposes. Clinical consideration and professional judgment should 1500 be applied to any positive drug screen result due to possible 1600 interfering substances. A more specific alternate chemical method 1700 must be used in order to obtain a confirmed analytical result.  1800 Gas chromato graphy / mass spectrometry (GC/MS) is the preferred 1900 confirmatory method.   Urinalysis complete, with microscopic ([facility] only)     Status: Abnormal   Collection Time: 02/16/16  8:23 PM  Result Value Ref Range   Color, Urine AMBER (A) YELLOW   APPearance CLOUDY (A) CLEAR   Glucose, UA 50 (A) NEGATIVE mg/dL   Bilirubin Urine NEGATIVE NEGATIVE   Ketones, ur 2+ (A) NEGATIVE mg/dL   Specific Gravity, Urine 1.023 1.005 - 1.030   Hgb urine dipstick 2+ (A) NEGATIVE   pH 5.0 5.0 - 8.0   Protein, ur >500 (A) NEGATIVE mg/dL   Nitrite NEGATIVE NEGATIVE   Leukocytes, UA NEGATIVE NEGATIVE   RBC /  HPF 0-5 0 - 5 RBC/hpf   WBC, UA 6-30 0 - 5 WBC/hpf   Bacteria, UA RARE (A) NONE SEEN   Squamous Epithelial / LPF TOO NUMEROUS TO COUNT (A) NONE SEEN   Mucous PRESENT    Hyaline Casts, UA PRESENT   Blood gas, venous     Status: Abnormal   Collection Time: 02/16/16  9:52 PM  Result Value Ref Range   FIO2 0.21    pH, Ven 7.47 (H) 7.320 - 7.430   pCO2, Ven 38 (L) 44.0 - 60.0 mmHg   pO2, Ven 40.0 31.0 - 45.0 mmHg   Bicarbonate 27.7 21.0 - 28.0 mEq/L   Acid-Base Excess 3.9 (H) 0.0 - 3.0 mmol/L   O2 Saturation 78.9 %    Patient temperature 37.0    Collection site VENOUS    Sample type VENOUS   Ethanol     Status: None   Collection Time: 02/17/16  4:01 PM  Result Value Ref Range   Alcohol, Ethyl (B) <5 <5 mg/dL    Comment:        LOWEST DETECTABLE LIMIT FOR SERUM ALCOHOL IS 5 mg/dL FOR MEDICAL PURPOSES ONLY     Blood Alcohol level:  Lab Results  Component Value Date   ETH <5 02/17/2016   ETH 336* 70/96/2836    Metabolic Disorder Labs:  Lab Results  Component Value Date   HGBA1C 5.4 11/21/2015   MPG 108 11/21/2015   No results found for: PROLACTIN No results found for: CHOL, TRIG, HDL, CHOLHDL, VLDL, LDLCALC  Current Medications: Current Facility-Administered Medications  Medication Dose Route Frequency Provider Last Rate Last Dose  . acetaminophen (TYLENOL) tablet 650 mg  650 mg Oral Q6H PRN Laverle Hobby, PA-C      . alum & mag hydroxide-simeth (MAALOX/MYLANTA) 200-200-20 MG/5ML suspension 30 mL  30 mL Oral Q4H PRN Laverle Hobby, PA-C      . FLUoxetine (PROZAC) capsule 20 mg  20 mg Oral Daily Laverle Hobby, PA-C      . hydrOXYzine (ATARAX/VISTARIL) tablet 25 mg  25 mg Oral Q6H PRN Laverle Hobby, PA-C      . loperamide (IMODIUM) capsule 2-4 mg  2-4 mg Oral PRN Laverle Hobby, PA-C      . LORazepam (ATIVAN) tablet 1 mg  1 mg Oral Q6H PRN Laverle Hobby, PA-C      . LORazepam (ATIVAN) tablet 1 mg  1 mg Oral QID Laverle Hobby, PA-C   1 mg at 02/17/16 2150   Followed by  . [START ON 02/19/2016] LORazepam (ATIVAN) tablet 1 mg  1 mg Oral TID Laverle Hobby, PA-C       Followed by  . [START ON 02/20/2016] LORazepam (ATIVAN) tablet 1 mg  1 mg Oral BID Laverle Hobby, PA-C       Followed by  . [START ON 02/21/2016] LORazepam (ATIVAN) tablet 1 mg  1 mg Oral Daily Caedyn Raygoza E Makinzie Considine, PA-C      . magnesium hydroxide (MILK OF MAGNESIA) suspension 30 mL  30 mL Oral Daily PRN Laverle Hobby, PA-C      . multivitamin with minerals tablet 1 tablet  1 tablet Oral Daily Laverle Hobby, PA-C       . ondansetron (ZOFRAN-ODT) disintegrating tablet 4 mg  4 mg Oral Q6H PRN Laverle Hobby, PA-C      . risperiDONE (RISPERDAL) tablet 0.5 mg  0.5 mg Oral BID Laverle Hobby, PA-C   0.5  mg at 02/17/16 2148  . thiamine (B-1) injection 100 mg  100 mg Intramuscular Once Laverle Hobby, PA-C   100 mg at 02/17/16 2150  . thiamine (VITAMIN B-1) tablet 100 mg  100 mg Oral Daily Laverle Hobby, PA-C      . traZODone (DESYREL) tablet 50 mg  50 mg Oral QHS,MR X 1 Laverle Hobby, PA-C   50 mg at 02/17/16 2150   PTA Medications: Prescriptions prior to admission  Medication Sig Dispense Refill Last Dose  . amoxicillin (AMOXIL) 500 MG capsule Take 1 capsule (500 mg total) by mouth every 12 (twelve) hours. For infection (Patient not taking: Reported on 02/16/2016)   Completed Course at Unknown time  . FLUoxetine (PROZAC) 40 MG capsule Take 1 capsule (40 mg total) by mouth daily. For depression 30 capsule 0 unknown at unknown  . hydrOXYzine (ATARAX/VISTARIL) 25 MG tablet Take 1 tablet (25 mg total) by mouth every 6 (six) hours as needed for anxiety. 60 tablet 0 PRN at PRN  . mirtazapine (REMERON) 7.5 MG tablet Take 1 tablet (7.5 mg total) by mouth at bedtime. For insomnia 30 tablet 0 unknown at unknown  . nicotine (NICODERM CQ - DOSED IN MG/24 HOURS) 21 mg/24hr patch Place 1 patch (21 mg total) onto the skin daily. For smoking cessation 28 patch 0 unknown at unknown  . risperiDONE (RISPERDAL) 0.5 MG tablet Take 1 tablet (0.5 mg total) by mouth 2 (two) times daily. For mood control 60 tablet 0 unknown at unknown    Musculoskeletal: Strength & Muscle Tone: within normal limits Gait & Station: normal Patient leans: N/A  Psychiatric Specialty Exam: Physical Exam  Nursing note and vitals reviewed. Constitutional: She is oriented to person, place, and time. She appears well-developed and well-nourished.  HENT:  Head: Normocephalic.  Neurological: She is alert and oriented to person, place, and time. No  cranial nerve deficit.  Skin: Skin is warm and dry.    Review of Systems  Musculoskeletal: Positive for joint pain.  Psychiatric/Behavioral: Positive for substance abuse. The patient is nervous/anxious.   All other systems reviewed and are negative.   Blood pressure 113/53, pulse 113, temperature 98.9 F (37.2 C), temperature source Oral, resp. rate 18, height _0  (1.676 m), weight 55.339 kg (122 lb), SpO2 96 %.Body mass index is 19.7 kg/(m^2).  General Appearance: Disheveled  Eye Sport and exercise psychologist::  Fair  Speech:  Garbled  Volume:  Decreased  Mood:  Dysphoric  Affect:  Congruent  Thought Process:  Circumstantial  Orientation:  Full (Time, Place, and Person)  Thought Content:  Negative  Suicidal Thoughts:  No  Homicidal Thoughts:  No  Memory:  Immediate;   Fair  Judgement:  Impaired  Insight:  Lacking  Psychomotor Activity:  Normal  Concentration:  Fair  Recall:  [employer] of Knowledge:Fair  Language: Good  Akathisia:  Negative  Handed:  Right  AIMS (if indicated):     Assets:  Desire for Improvement  ADL's:  Intact  Cognition: WNL  Sleep:        Treatment Plan Summary: Plan adnit to [facility] Observation Unit for crises intervention, safety and stabilization. TTS to assist with disposition plans in the AM  Observation Level/Precautions:  Continuous Observation  Laboratory:    Psychotherapy:    Medications:    Consultations:    Discharge Concerns:    Estimated LOS:  Other:     I certify that inpatient services furnished can reasonably be expected to improve the patient's condition.  Ashland Wiseman E, PA-C 3/22/201712:25 AM

## 2016-02-18 NOTE — Progress Notes (Signed)
D/C instructions/meds/follow-up appointments reviewed, pt verbalized understanding, pt's belongings returned to pt, denies SI/HI/AVH. 

## 2016-02-18 NOTE — Discharge Instructions (Signed)
Patient will follow [facility] [place] for medication evaluation/management.

## 2016-02-24 ENCOUNTER — Inpatient Hospital Stay
Admission: EM | Admit: 2016-02-24 | Discharge: 2016-02-27 | DRG: 897 | Disposition: A | Discharge status: Home / Self Care | Payer: Self-pay | Attending: Internal Medicine | Admitting: Internal Medicine

## 2016-02-24 ENCOUNTER — Encounter: Payer: Self-pay | Admitting: Emergency Medicine

## 2016-02-24 DIAGNOSIS — M1990 Unspecified osteoarthritis, unspecified site: Secondary | ICD-10-CM | POA: Diagnosis present

## 2016-02-24 DIAGNOSIS — K7010 Alcoholic hepatitis without ascites: Secondary | ICD-10-CM | POA: Diagnosis present

## 2016-02-24 DIAGNOSIS — F10239 Alcohol dependence with withdrawal, unspecified: Principal | ICD-10-CM | POA: Diagnosis present

## 2016-02-24 DIAGNOSIS — F1024 Alcohol dependence with alcohol-induced mood disorder: Secondary | ICD-10-CM | POA: Diagnosis present

## 2016-02-24 DIAGNOSIS — F17210 Nicotine dependence, cigarettes, uncomplicated: Secondary | ICD-10-CM | POA: Diagnosis present

## 2016-02-24 DIAGNOSIS — F10920 Alcohol use, unspecified with intoxication, uncomplicated: Secondary | ICD-10-CM

## 2016-02-24 DIAGNOSIS — F19931 Other psychoactive substance use, unspecified with withdrawal delirium: Secondary | ICD-10-CM

## 2016-02-24 DIAGNOSIS — F10939 Alcohol use, unspecified with withdrawal, unspecified: Secondary | ICD-10-CM | POA: Diagnosis present

## 2016-02-24 DIAGNOSIS — Z716 Tobacco abuse counseling: Secondary | ICD-10-CM

## 2016-02-24 DIAGNOSIS — Z79899 Other long term (current) drug therapy: Secondary | ICD-10-CM

## 2016-02-24 DIAGNOSIS — E871 Hypo-osmolality and hyponatremia: Secondary | ICD-10-CM | POA: Diagnosis present

## 2016-02-24 DIAGNOSIS — F19231 Other psychoactive substance dependence with withdrawal delirium: Secondary | ICD-10-CM

## 2016-02-24 DIAGNOSIS — R569 Unspecified convulsions: Secondary | ICD-10-CM

## 2016-02-24 LAB — COMPREHENSIVE METABOLIC PANEL
ALBUMIN: 3.8 g/dL (ref 3.5–5.0)
ALK PHOS: 107 U/L (ref 38–126)
ALK PHOS: 98 U/L (ref 38–126)
ALT: 120 U/L — ABNORMAL HIGH (ref 14–54)
ALT: 97 U/L — AB (ref 14–54)
ANION GAP: 20 — AB (ref 5–15)
AST: 169 U/L — ABNORMAL HIGH (ref 15–41)
AST: 113 U/L — AB (ref 15–41)
Albumin: 3.7 g/dL (ref 3.5–5.0)
Anion gap: 20 — ABNORMAL HIGH (ref 5–15)
BILIRUBIN TOTAL: 1.2 mg/dL (ref 0.3–1.2)
BUN: 11 mg/dL (ref 6–20)
BUN: 7 mg/dL (ref 6–20)
CALCIUM: 8.1 mg/dL — AB (ref 8.9–10.3)
CALCIUM: 8.7 mg/dL — AB (ref 8.9–10.3)
CHLORIDE: 93 mmol/L — AB (ref 101–111)
CO2: 21 mmol/L — ABNORMAL LOW (ref 22–32)
CO2: 20 mmol/L — AB (ref 22–32)
CREATININE: 0.52 mg/dL (ref 0.44–1.00)
Chloride: 95 mmol/L — ABNORMAL LOW (ref 101–111)
Creatinine, Ser: 0.64 mg/dL (ref 0.44–1.00)
GFR calc non Af Amer: >60 mL/min (ref >60)
Glucose, Bld: 69 mg/dL (ref 65–99)
Glucose, Bld: 125 mg/dL — ABNORMAL HIGH (ref 65–99)
POTASSIUM: 3.8 mmol/L (ref 3.5–5.1)
Potassium: 3.7 mmol/L (ref 3.5–5.1)
SODIUM: 133 mmol/L — AB (ref 135–145)
Sodium: 136 mmol/L (ref 135–145)
TOTAL PROTEIN: 6.8 g/dL (ref 6.5–8.1)
Total Bilirubin: 1.5 mg/dL — ABNORMAL HIGH (ref 0.3–1.2)
Total Protein: 6.7 g/dL (ref 6.5–8.1)

## 2016-02-24 LAB — URINALYSIS COMPLETE WITH MICROSCOPIC (ARMC ONLY)
BILIRUBIN URINE: NEGATIVE
Glucose, UA: NEGATIVE mg/dL
Leukocytes, UA: NEGATIVE
NITRITE: NEGATIVE
Protein, ur: >500 mg/dL — AB
Specific Gravity, Urine: 1.02 (ref 1.005–1.030)
pH: 5 (ref 5.0–8.0)

## 2016-02-24 LAB — CBC
HEMATOCRIT: 41.5 % (ref 35.0–47.0)
Hemoglobin: 14.2 g/dL (ref 12.0–16.0)
MCH: 31.6 pg (ref 26.0–34.0)
MCHC: 34.3 g/dL (ref 32.0–36.0)
MCV: 92.1 fL (ref 80.0–100.0)
PLATELETS: 207 10*3/uL (ref 150–440)
RBC: 4.51 MIL/uL (ref 3.80–5.20)
RDW: 16.1 % — AB (ref 11.5–14.5)
WBC: 5.6 10*3/uL (ref 3.6–11.0)

## 2016-02-24 LAB — URINE DRUG SCREEN, QUALITATIVE (ARMC ONLY)
AMPHETAMINES, UR SCREEN: NOT DETECTED
BARBITURATES, UR SCREEN: NOT DETECTED
BENZODIAZEPINE, UR SCRN: NOT DETECTED
Cannabinoid 50 Ng, Ur ~~LOC~~: NOT DETECTED
Cocaine Metabolite,Ur ~~LOC~~: NOT DETECTED
MDMA (ECSTASY) UR SCREEN: NOT DETECTED
METHADONE SCREEN, URINE: NOT DETECTED
Opiate, Ur Screen: NOT DETECTED
PHENCYCLIDINE (PCP) UR S: NOT DETECTED
TRICYCLIC, UR SCREEN: NOT DETECTED

## 2016-02-24 LAB — ETHANOL
ALCOHOL ETHYL (B): 90 mg/dL — AB (ref <5)
Alcohol, Ethyl (B): 370 mg/dL (ref <5)
Alcohol, Ethyl (B): 232 mg/dL — ABNORMAL HIGH (ref <5)

## 2016-02-24 LAB — CBC WITH DIFFERENTIAL/PLATELET
BASOS ABS: 0.1 10*3/uL (ref 0–0.1)
Basophils Relative: 1 %
EOS ABS: 0 10*3/uL (ref 0–0.7)
Eosinophils Relative: 1 %
HCT: 39.2 % (ref 35.0–47.0)
HEMOGLOBIN: 13.2 g/dL (ref 12.0–16.0)
LYMPHS ABS: 1.9 10*3/uL (ref 1.0–3.6)
LYMPHS PCT: 29 %
MCH: 31.4 pg (ref 26.0–34.0)
MCHC: 33.7 g/dL (ref 32.0–36.0)
MCV: 93.3 fL (ref 80.0–100.0)
Monocytes Absolute: 1 10*3/uL — ABNORMAL HIGH (ref 0.2–0.9)
Monocytes Relative: 16 %
NEUTROS PCT: 53 %
Neutro Abs: 3.4 10*3/uL (ref 1.4–6.5)
PLATELETS: 190 10*3/uL (ref 150–440)
RBC: 4.21 MIL/uL (ref 3.80–5.20)
RDW: 16.1 % — ABNORMAL HIGH (ref 11.5–14.5)
WBC: 6.4 10*3/uL (ref 3.6–11.0)

## 2016-02-24 MED ORDER — LORAZEPAM 2 MG/ML IJ SOLN
INTRAMUSCULAR | Status: AC
Start: 2016-02-24 — End: 2016-02-25
  Filled 2016-02-24: qty 1

## 2016-02-24 MED ORDER — LORAZEPAM 2 MG/ML IJ SOLN
INTRAMUSCULAR | Status: AC
  Administered 2016-02-24: 1 mg via INTRAVENOUS
  Filled 2016-02-24: qty 1

## 2016-02-24 MED ORDER — ACETAMINOPHEN 325 MG PO TABS: 650.0000 mg | ORAL_TABLET | Freq: Four times a day (QID) | ORAL | Status: DC | PRN

## 2016-02-24 MED ORDER — LORAZEPAM 2 MG/ML IJ SOLN
1.0000 mg | Freq: Once | INTRAMUSCULAR | Status: AC
  Administered 2016-02-24 (×2): 1 mg via INTRAVENOUS

## 2016-02-24 MED ORDER — SODIUM CHLORIDE 0.9 % IV BOLUS (SEPSIS)
1000.0000 mL | Freq: Once | INTRAVENOUS | Status: AC
  Administered 2016-02-24: 1000 mL via INTRAVENOUS

## 2016-02-24 MED ORDER — LORAZEPAM 1 MG PO TABS: 1.0000 mg | ORAL_TABLET | Freq: Four times a day (QID) | ORAL | Status: DC | PRN

## 2016-02-24 MED ORDER — LORAZEPAM 2 MG PO TABS
0.0000 mg | ORAL_TABLET | Freq: Four times a day (QID) | ORAL | Status: AC
  Administered 2016-02-24: 1 mg via ORAL
  Administered 2016-02-24: 2 mg via ORAL
  Administered 2016-02-25 (×2): 1 mg via ORAL
  Administered 2016-02-25 (×2): 2 mg via ORAL
  Filled 2016-02-24 (×7): qty 1

## 2016-02-24 MED ORDER — PNEUMOCOCCAL VAC POLYVALENT 25 MCG/0.5ML IJ INJ
0.5000 mL | INJECTION | INTRAMUSCULAR | Status: AC
  Administered 2016-02-25: 11:00:00 0.5 mL via INTRAMUSCULAR
  Filled 2016-02-24: qty 0.5

## 2016-02-24 MED ORDER — METOPROLOL TARTRATE 25 MG PO TABS
12.5000 mg | ORAL_TABLET | Freq: Once | ORAL | Status: AC
  Administered 2016-02-24: 12.5 mg via ORAL
  Filled 2016-02-24: qty 1

## 2016-02-24 MED ORDER — THIAMINE HCL 100 MG/ML IJ SOLN
Freq: Once | INTRAVENOUS | Status: AC
  Administered 2016-02-24: 17:00:00 via INTRAVENOUS
  Filled 2016-02-24: qty 1000

## 2016-02-24 MED ORDER — LORAZEPAM 2 MG/ML IJ SOLN
INTRAMUSCULAR | Status: AC
  Administered 2016-02-24: 23:00:00 1 mg
  Filled 2016-02-24: qty 1

## 2016-02-24 MED ORDER — LORAZEPAM 2 MG/ML IJ SOLN
1.0000 mg | Freq: Four times a day (QID) | INTRAMUSCULAR | Status: DC | PRN
  Administered 2016-02-25 – 2016-02-26 (×2): 1 mg via INTRAVENOUS
  Filled 2016-02-24 (×2): qty 1

## 2016-02-24 MED ORDER — ENOXAPARIN SODIUM 40 MG/0.4ML ~~LOC~~ SOLN
40.0000 mg | SUBCUTANEOUS | Status: DC
Start: 2016-02-24 — End: 2016-02-27
  Administered 2016-02-25 – 2016-02-26 (×2): 40 mg via SUBCUTANEOUS
  Filled 2016-02-24 (×2): qty 0.4

## 2016-02-24 MED ORDER — LORAZEPAM 2 MG PO TABS
2.0000 mg | ORAL_TABLET | Freq: Once | ORAL | Status: AC
  Administered 2016-02-24: 2 mg via ORAL
  Filled 2016-02-24: qty 1

## 2016-02-24 MED ORDER — LORAZEPAM 2 MG PO TABS
0.0000 mg | ORAL_TABLET | Freq: Two times a day (BID) | ORAL | Status: DC
  Administered 2016-02-26: 11:00:00 2 mg via ORAL
  Filled 2016-02-24: qty 1

## 2016-02-24 NOTE — Progress Notes (Signed)
Dr. Anne HahnWillis notified patient HR has been Sinus tach on the monitor since admission low 100's but has jumped up twice to 130's to low 140's. Pt BP stable, asymptomatic. MD acknowledged. Will continue to assess and notify MD of any changes.

## 2016-02-24 NOTE — ED Notes (Signed)
BEHAVIORAL HEALTH ROUNDING Patient sleeping: Yes.   Patient alert and oriented: eyes closed  Appears asleep Behavior appropriate: Yes.  ; If no, describe:  Nutrition and fluids offered: Yes  Toileting and hygiene offered: sleeping Sitter present: q 15 minute observations and security monitoring Law enforcement present: yes  ODS 

## 2016-02-24 NOTE — ED Notes (Signed)
BEHAVIORAL HEALTH ROUNDING Patient sleeping: No. Patient alert and oriented: yes Behavior appropriate: Yes.  ; If no, describe:  Nutrition and fluids offered: yes Toileting and hygiene offered: Yes  Sitter present: q15 minute observations and security  monitoring Law enforcement present: Yes  ODS  

## 2016-02-24 NOTE — ED Notes (Signed)
Patient observed lying in hallway bed with eyes closed  Even, unlabored respirations observed   NAD pt appears to be sleeping  I will continue to monitor along with every 15 minute visual observations and ongoing security monitoring    etoh 90 results faxed to [facility] - we will await transport

## 2016-02-24 NOTE — ED Notes (Signed)
Pt moved from room 24 to 19H  Pt can be observed digging in her pocketbook - redraw ethanol inapprox 1 hour  Fax results to [facility]  [phone]

## 2016-02-24 NOTE — ED Notes (Signed)
Called [facility] to let them know that the pt will not be discharged when their transporter finally arrives -  [facility] - "delay in detox treatment caused this pt to go into DT's."  pt had been administered lorazepam 2 mg once per CIWA scale  And has now received 1mg  IV  I [facility] back [facility] [place] [facility] - [facility] acted rudely on the phone and was accusing towards us that this pt should not come to them anyway    Pt was accepted to [facility] greater than 8 hours ago - I received word this am from [place] [facility] that the pt would leave when ETOH was <140   Redraws were done and pt reached her goal at approx 1245

## 2016-02-24 NOTE — ED Notes (Signed)
Pt observed lying in hallway bed - pt with nausea and a small amount of mucus rich emesis is in the emesis bag  - pt appears to be shaking and allowing her eyes to roll back  - pt reassured and comforted  Lorazepam has been administered  Pt encouraged to allow time for med to work  [facility]SS

## 2016-02-24 NOTE — ED Notes (Signed)
Lab called etoh level of 370 - reported to MD Manson PasseyBrown

## 2016-02-24 NOTE — Progress Notes (Signed)
Rapid response called to room. Pt alert some anxiety noted. HR 120"s sinus tacy on the monitor pt on CIWA protocol. Ativan will be administered by care nurse

## 2016-02-24 NOTE — H&P (Addendum)
[facility] - [place] at [facility]   PATIENT NAME: Amanda Huff    MR#:  [id number]  DATE OF BIRTH:  [birth year]  DATE OF ADMISSION:  02/24/2016  PRIMARY CARE PHYSICIAN: No PCP Per Patient   REQUESTING/REFERRING PHYSICIAN: Dr. Daryel November  CHIEF COMPLAINT:   Chief Complaint  Patient presents with  . Alcohol Intoxication    HISTORY OF PRESENT ILLNESS:  Amanda Huff  is a 55 y.o. female with a known history of alcohol abuse, frequent admission to the health units for detox comes in requesting alcohol detox again. Patient is waiting for bed at [facility]. Meanwhile she developed to alcohol-induced seizures this afternoon. Right now she is postictal. According to the history and she was oriented and she is getting [place] PROTOCOL. Patient admits to drinking approximately fifth of vODKA IN , lasted 24 hours. And has been seen in the emergency room multiple times for the same problem.  PAST MEDICAL HISTORY:   Past Medical History  Diagnosis Date  . ETOH abuse   . Arthritis     PAST SURGICAL HISTOIRY:   Past Surgical History  Procedure Laterality Date  . Cesarean section      SOCIAL HISTORY:   Social History  Substance Use Topics  . Smoking status: Current Every Day Smoker -- 0.50 packs/day    Types: Cigarettes  . Smokeless tobacco: Not on file  . Alcohol Use: Yes     Comment: 1 bottle of vodka every 3 days    FAMILY HISTORY:  History reviewed. No pertinent family history.  DRUG ALLERGIES:  No Known Allergies  REVIEW OF SYSTEMS:  CONSTITUTIONAL: No fever, fatigue or weakness.  EYES: No blurred or double vision.  EARS, NOSE, AND THROAT: No tinnitus or ear pain.  RESPIRATORY: No cough, shortness of breath, wheezing or hemoptysis.  CARDIOVASCULAR: No chest pain, orthopnea, edema.  GASTROINTESTINAL: No nausea, vomiting, diarrhea or abdominal pain.  GENITOURINARY: No dysuria, hematuria.  ENDOCRINE: No polyuria, nocturia,  HEMATOLOGY: No anemia,  easy bruising or bleeding SKIN: No rash or lesion. MUSCULOSKELETAL: No joint pain or arthritis.   NEUROLOGIC: No tingling, numbness, weakness.  PSYCHIATRY: No anxiety or depression.   MEDICATIONS AT HOME:   Prior to Admission medications   Medication Sig Start Date End Date Taking? Authorizing Provider  hydrOXYzine (ATARAX/VISTARIL) 25 MG tablet Take 1 tablet (25 mg total) by mouth every 6 (six) hours as needed. Patient not taking: Reported on 02/24/2016 02/18/16   Beau Fanny, FNP  mirtazapine (REMERON) 7.5 MG tablet Take 1 tablet (7.5 mg total) by mouth at bedtime. For insomnia Patient not taking: Reported on 02/24/2016 02/18/16   Beau Fanny, FNP  risperiDONE (RISPERDAL) 0.5 MG tablet Take 1 tablet (0.5 mg total) by mouth 2 (two) times daily. For mood control Patient not taking: Reported on 02/24/2016 02/18/16   Beau Fanny, FNP  traZODone (DESYREL) 50 MG tablet Take 1 tablet (50 mg total) by mouth at bedtime as needed for sleep. Patient not taking: Reported on 02/24/2016 02/18/16   Beau Fanny, FNP      VITAL SIGNS:  Blood pressure 189/126, pulse 135, temperature 98.2 F (36.8 C), temperature source Oral, resp. rate 20, height  (1.6 m), weight 55.339 kg (122 lb), SpO2 95 %.  PHYSICAL EXAMINATION:  GENERAL:  55 y.o.-year-old patient lying in the bed with no acute distress.  EYES: Pupils equal, round, reactive to light and accommodation. No scleral icterus. Extraocular muscles intact.  HEENT: Head atraumatic, normocephalic.  Oropharynx and nasopharynx clear.  NECK:  Supple, no jugular venous distention. No thyroid enlargement, no tenderness.  LUNGS: Normal breath sounds bilaterally, no wheezing, rales,rhonchi or crepitation. No use of accessory muscles of respiration.  CARDIOVASCULAR: S1, S2 normal. No murmurs, rubs, or gallops.  ABDOMEN: Soft, nontender, nondistended. Bowel sounds present. No organomegaly or mass.  EXTREMITIES: No pedal edema, cyanosis, or clubbing.   NEUROLOGIC: Cranial nerves II through XII are intact. Muscle strength 5/5 in all extremities. Sensation intact. Gait not checked.  PSYCHIATRIC: The patient is alert and oriented x 3.  SKIN: No obvious rash, lesion, or ulcer.   LABORATORY PANEL:   CBC  Recent Labs Lab 02/24/16 1534  WBC 6.4  HGB 13.2  HCT 39.2  PLT 190   ------------------------------------------------------------------------------------------------------------------  Chemistries   Recent Labs Lab 02/24/16 0242  NA 136  K 3.8  CL 95*  CO2 21*  GLUCOSE 69  BUN 11  CREATININE 0.64  CALCIUM 8.1*  AST 169*  ALT 120*  ALKPHOS 107  BILITOT 1.2   ------------------------------------------------------------------------------------------------------------------  Cardiac Enzymes No results for input(s): TROPONINI in the last 168 hours. ------------------------------------------------------------------------------------------------------------------  RADIOLOGY:  No results found.  EKG:   Orders placed or performed during the hospital encounter of 05/04/15  . EKG 12-Lead  . EKG 12-Lead  . EKG    IMPRESSION AND PLAN:   #1. Alcohol withdrawal seizures: Placed on Ativan, continue Ativan as per withdrawal protocol, place on seizure precautions. Watch for her Delerium Tremens. Psych consult. Patient waiting for bed at [facility],meanwhile developed seizures requiring admission to medical unit here.continue IV hydration with thiamine and folic acid,continue NPO till she  Is more  Alert.neuro consult if pt gets recurrent seizures    #2 alcohol acute hepatitis: Continue IV hydration, check LFTs tomorrow. 3.alcohol abuse with alcohol-induced mood disorder: And has severe alcohol dependence had multiple episodes of behavioral health unit admissions for alcohol detox. Has history of insomnia, anxiety ,mood disorder. She was given Prozac, mirtazapine, Risperdal during the discharge [facility] from [facility]. 4.reviewed records from previous BHU admisssions All the records are reviewed and case discussed with ED provider. Management plans discussed with the patient, family and they are in agreement.  CODE STATUS: Full  TOTAL TIME TAKING CARE OF THIS PATIENT: 55 minutes.    Katha HammingKONIDENA,Laquilla Dault M.D on 02/24/2016 at 4:05 PM  Between 7am to 6pm - Pager - [phone]  After 6pm go to [email or web address] - password [email or web address] [facility]  [facility]  Office  [phone]  CC: Primary care physician; No PCP Per Patient  Note: This dictation was prepared with Dragon dictation along with smaller phrase technology. Any transcriptional errors that result from this process are unintentional.

## 2016-02-24 NOTE — ED Notes (Signed)
Pt arrived via EMS from home. Pt comes in after drinking a fifth of vodka over the last 24 hours. Reports that she hasn't eaten for two days. Is requesting detox. Has a cough and room air sats of 87-88%. EMS did not obtain a pulse ox prior to arrival in ED.

## 2016-02-24 NOTE — ED Provider Notes (Signed)
[facility] Emergency Department Provider Note  ____________________________________________  Time seen: 1:50 AM  I have reviewed the triage vital signs and the nursing notes.  History Limited secondary to clinical acute intoxication HISTORY  Chief Complaint Alcohol Intoxication    HPI Amanda Huff is a 55 y.o. female presents via EMS will request for alcohol detox. Patient admits to drinking approximately fifth of vodka and last 24 hours. Patient admits to very poor by mouth intake in the past 2 days. Patient recently recently seen in the emergency department for similar complaint on 02/17/2016.    Past Medical History  Diagnosis Date  . ETOH abuse   . Arthritis     Patient Active Problem List   Diagnosis Date Noted  . MDD (major depressive disorder), recurrent severe, without psychosis (HCC) 02/18/2016  . Substance induced mood disorder (HCC) 02/17/2016  . Alcohol withdrawal (HCC) 02/17/2016  . Arthritis 02/17/2016  . 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 Alcohol use disorder, severe, dependence (HCC) 11/11/2015  . Alcohol abuse with alcohol-induced mood disorder (HCC) 10/29/2015  . Depression, major, recurrent, moderate (HCC) 10/29/2015    Past Surgical History  Procedure Laterality Date  . Cesarean section      Current Outpatient Rx  Name  Route  Sig  Dispense  Refill  . hydrOXYzine (ATARAX/VISTARIL) 25 MG tablet   Oral   Take 1 tablet (25 mg total) by mouth every 6 (six) hours as needed.   21 tablet   0   . mirtazapine (REMERON) 7.5 MG tablet   Oral   Take 1 tablet (7.5 mg total) by mouth at bedtime. For insomnia   14 tablet   0   . risperiDONE (RISPERDAL) 0.5 MG tablet   Oral   Take 1 tablet (0.5 mg total) by mouth 2 (two) times daily. For mood control   28 tablet   0   . traZODone (DESYREL) 50 MG tablet   Oral   Take 1 tablet (50 mg total) by mouth at bedtime as needed for sleep.   14  tablet   0     Allergies No known drug allergies History reviewed. No pertinent family history.  Social History Social History  Substance Use Topics  . Smoking status: Current Every Day Smoker -- 0.50 packs/day    Types: Cigarettes  . Smokeless tobacco: None  . Alcohol Use: Yes     Comment: 1 bottle of vodka every 3 days    Review of Systems  Constitutional: Negative for fever. Eyes: Negative for visual changes. ENT: Negative for sore throat. Cardiovascular: Negative for chest pain. Respiratory: Negative for shortness of breath. Gastrointestinal: Negative for abdominal pain, vomiting and diarrhea. Genitourinary: Negative for dysuria. Musculoskeletal: Negative for back pain. Skin: Negative for rash. Neurological: Negative for headaches, focal weakness or numbness. Psychiatric:Positive for alcohol abuse  10-point ROS otherwise negative.  ____________________________________________   PHYSICAL EXAM:  VITAL SIGNS: ED Triage Vitals  Enc Vitals Group     BP 02/24/16 0150 138/80 mmHg     Pulse Rate 02/24/16 0150 116     Resp 02/24/16 0150 20     Temp 02/24/16 0150 98.5 F (36.9 C)     Temp Source 02/24/16 0150 Oral     SpO2 02/24/16 0150 87 %     Weight 02/24/16 0150 122 lb (55.339 kg)     Height 02/24/16 0150 5\' 3"  (1.6 m)     Head Cir --      Peak  Flow --      Pain Score 02/24/16 0407 2     Pain Loc --      Pain Edu? --      Excl. in GC? --      Constitutional: Alert and oriented. Clinically intoxicated EtOH on breath. Eyes: Conjunctivae are normal. PERRL. Normal extraocular movements. ENT   Head: Normocephalic and atraumatic.   Nose: No congestion/rhinnorhea.   Mouth/Throat: Mucous membranes are moist.   Neck: No stridor. Hematological/Lymphatic/Immunilogical: No cervical lymphadenopathy. Cardiovascular: Normal rate, regular rhythm. Normal and symmetric distal pulses are present in all extremities. No murmurs, rubs, or  gallops. Respiratory: Normal respiratory effort without tachypnea nor retractions. Breath sounds are clear and equal bilaterally. No wheezes/rales/rhonchi. Gastrointestinal: Soft and nontender. No distention. There is no CVA tenderness. Genitourinary: deferred Musculoskeletal: Nontender with normal range of motion in all extremities. No joint effusions.  No lower extremity tenderness nor edema. Neurologic:  Normal speech and language. No gross focal neurologic deficits are appreciated. Speech is normal.  Skin:  Skin is warm, dry and intact. No rash noted. Psychiatric: Mood and affect are normal. Speech and behavior are normal. Patient exhibits appropriate insight and judgment.  ____________________________________________    LABS (pertinent positives/negatives)  Labs Reviewed  CBC - Abnormal; Notable for the following:    RDW 16.1 (*)    All other components within normal limits  ETHANOL - Abnormal; Notable for the following:    Alcohol, Ethyl (B) 370 (*)    All other components within normal limits  COMPREHENSIVE METABOLIC PANEL - Abnormal; Notable for the following:    Chloride 95 (*)    CO2 21 (*)    Calcium 8.1 (*)    AST 169 (*)    ALT 120 (*)    Anion gap 20 (*)    All other components within normal limits  URINALYSIS COMPLETEWITH MICROSCOPIC ([facility] ONLY) - Abnormal; Notable for the following:    Color, Urine YELLOW (*)    APPearance HAZY (*)    Ketones, ur 2+ (*)    Hgb urine dipstick 2+ (*)    Protein, ur >500 (*)    Bacteria, UA RARE (*)    Squamous Epithelial / LPF TOO NUMEROUS TO COUNT (*)    All other components within normal limits  URINE DRUG SCREEN, QUALITATIVE ([facility] ONLY)      INITIAL IMPRESSION / ASSESSMENT AND PLAN / ED COURSE  Pertinent labs & imaging results that were available during my care of the patient were reviewed by me and considered in my medical decision making (see chart for details).  Await psychiatric consultation for alcohol  detox.  ____________________________________________   FINAL CLINICAL IMPRESSION(S) / ED DIAGNOSES  Final diagnoses:  Alcohol intoxication, uncomplicated (HCC)      Darci Current, MD 02/24/16 [phone]

## 2016-02-24 NOTE — Progress Notes (Signed)
Rapid response called for pt due to HR increase to 155 and sustaining for longer than 1 minute. Pt c/o heart racing and chest tightness. PRN Ativan given. Nursing supervisor, rapid response nurse, and charge nurse at bedside. Dr. Anne HahnWillis notified of situation. See new orders. HR currently Sinus tach 111, BP 121/77. Will continue to assess and monitor closely.

## 2016-02-24 NOTE — ED Provider Notes (Signed)
Patient was stable in the ER for many hours, alcohol level was dropping. Patient appeared to be going into withdrawal, she was given Ativan seemed to improve her symptoms. She subsequently had 2 seizures of generalized tonic-clonic like activity. She's been given IV Ativan. She'll be admitted for withdrawal seizures and severe alcoholism  Emily FilbertJonathan E Williams, MD 02/24/16 1525

## 2016-02-24 NOTE — ED Notes (Signed)
Attempted to call report to 2A  - bed assignment has been changed - I then called and spoke with the nursing supervisor and I was told that they do not do bed control - chart then states that she will be going to 1C  I attempted to call report to 1C - I was told by Doreene BurkeSherita that the charge nurse will review the pt chart and approve or decline this pt for admission to that bed - report declined at this time

## 2016-02-24 NOTE — BH Assessment (Signed)
[facility] RN Amy. Amy reports that per Deanna ArtisKeisha, pt has a bed at [facility]. Amy says that they will fax over most recent BAL which needs to be below 114. [facility]TS will help Amy if additional info needed by [facility].  Evette Cristalaroline Paige Allsion [place], [place] Therapeutic Triage Specialist

## 2016-02-25 LAB — CBC
HCT: 38.3 % (ref 35.0–47.0)
Hemoglobin: 13 g/dL (ref 12.0–16.0)
MCH: 30.9 pg (ref 26.0–34.0)
MCHC: 34 g/dL (ref 32.0–36.0)
MCV: 90.9 fL (ref 80.0–100.0)
PLATELETS: 180 10*3/uL (ref 150–440)
RBC: 4.22 MIL/uL (ref 3.80–5.20)
RDW: 16 % — AB (ref 11.5–14.5)
WBC: 5 10*3/uL (ref 3.6–11.0)

## 2016-02-25 MED ORDER — METOPROLOL SUCCINATE ER 25 MG PO TB24
12.5000 mg | ORAL_TABLET | Freq: Once | ORAL | Status: AC
  Administered 2016-02-25: 12.5 mg via ORAL
  Filled 2016-02-25: qty 1

## 2016-02-25 MED ORDER — NICOTINE 14 MG/24HR TD PT24
14.0000 mg | MEDICATED_PATCH | Freq: Every day | TRANSDERMAL | Status: DC
  Administered 2016-02-26 – 2016-02-27 (×2): 14 mg via TRANSDERMAL
  Filled 2016-02-25 (×2): qty 1

## 2016-02-25 NOTE — Progress Notes (Signed)
[facility] - [place] at [facility]   PATIENT NAME: Amanda SimsLisa Huff    MR#:  [id number]  DATE OF BIRTH:  03/28/1961  SUBJECTIVE:  CHIEF COMPLAINT:   Chief Complaint  Patient presents with  . Alcohol Intoxication   Tremor and anxiety. REVIEW OF SYSTEMS:  CONSTITUTIONAL: No fever, fatigue or weakness.  EYES: No blurred or double vision.  EARS, NOSE, AND THROAT: No tinnitus or ear pain.  RESPIRATORY: No cough, shortness of breath, wheezing or hemoptysis.  CARDIOVASCULAR: No chest pain, orthopnea, edema.  GASTROINTESTINAL: No nausea, vomiting, diarrhea or abdominal pain.  GENITOURINARY: No dysuria, hematuria.  ENDOCRINE: No polyuria, nocturia,  HEMATOLOGY: No anemia, easy bruising or bleeding SKIN: No rash or lesion. MUSCULOSKELETAL: No joint pain or arthritis.   NEUROLOGIC: No tingling, numbness, weakness.  PSYCHIATRY: Tremor and anxiety. No depression.   DRUG ALLERGIES:  No Known Allergies  VITALS:  Blood pressure 127/80, pulse 106, temperature 99.1 F (37.3 C), temperature source Oral, resp. rate 20, height 5\' 6"  (1.676 m), weight 64.093 kg (141 lb 4.8 oz), SpO2 98 %.  PHYSICAL EXAMINATION:  GENERAL:  55 y.o.-year-old patient lying in the bed with no acute distress.  EYES: Pupils equal, round, reactive to light and accommodation. No scleral icterus. Extraocular muscles intact.  HEENT: Head atraumatic, normocephalic. Oropharynx and nasopharynx clear.  NECK:  Supple, no jugular venous distention. No thyroid enlargement, no tenderness.  LUNGS: Normal breath sounds bilaterally, no wheezing, rales,rhonchi or crepitation. No use of accessory muscles of respiration.  CARDIOVASCULAR: S1, S2 normal. No murmurs, rubs, or gallops.  ABDOMEN: Soft, nontender, nondistended. Bowel sounds present. No organomegaly or mass.  EXTREMITIES: No pedal edema, cyanosis, or clubbing.  NEUROLOGIC: Cranial nerves II through XII are intact. Muscle strength 5/5 in all extremities.  Sensation intact. Gait not checked.  PSYCHIATRIC: The patient is alert and oriented x 3.  SKIN: No obvious rash, lesion, or ulcer.    LABORATORY PANEL:   CBC  Recent Labs Lab 02/25/16 0441  WBC 5.0  HGB 13.0  HCT 38.3  PLT 180   ------------------------------------------------------------------------------------------------------------------  Chemistries   Recent Labs Lab 02/24/16 1534  NA 133*  K 3.7  CL 93*  CO2 20*  GLUCOSE 125*  BUN 7  CREATININE 0.52  CALCIUM 8.7*  AST 113*  ALT 97*  ALKPHOS 98  BILITOT 1.5*   ------------------------------------------------------------------------------------------------------------------  Cardiac Enzymes No results for input(s): TROPONINI in the last 168 hours. ------------------------------------------------------------------------------------------------------------------  RADIOLOGY:  No results found.  EKG:   Orders placed or performed during the hospital encounter of 05/04/15  . EKG 12-Lead  . EKG 12-Lead  . EKG    ASSESSMENT AND PLAN:   #1. Alcohol withdrawal seizures: continue Ativan as per withdrawal protocol, place on seizure precautions. Watch for her Delerium Tremens. F/u Psych consult.  continue IV hydration with thiamine and folic acid.   #2 alcohol acute hepatitis: Continue IV hydration, improving.  3. Alcohol abuse with alcohol-induced mood disorder:  F/u psych consult.  4. Hyponatremia. Possible Due to alcohol.  * tobacco abuse.  smoking cessation was counseled for 3 min.  All the records are reviewed and case discussed with Care Management/Social Workerr. Management plans discussed with the patient, family and they are in agreement.  CODE STATUS: full code.  TOTAL TIME TAKING CARE OF THIS PATIENT: 33 minutes.  Greater than 50% time was spent on coordination of care and face-to-face counseling.  POSSIBLE D/C IN 2 DAYS, DEPENDING ON CLINICAL CONDITION.   Shaune Pollackhen, Radford Pease M.D  on 02/25/2016  at 4:46 PM  Between 7am to 6pm - Pager - [phone]  After 6pm go to [email or web address] - password [email or web address] [facility]  [place]  [facility]  Office  [phone]  CC: Primary care physician; No PCP Per Patient

## 2016-02-25 NOTE — Progress Notes (Addendum)
Dr. Betti Cruzeddy notified pt has been having urgency and frequency tonight but not voiding a lot. HR also continues to remain sinus tach on the monitor increasing to 140's when up to bathroom. PRN Ativan given for tachycardia w/ no changes. MD acknowledged concerns. No new orders received. Will continue to monitor closely.

## 2016-02-25 NOTE — Progress Notes (Signed)
Dr. Betti Cruzeddy notified again of concern for patient HR increasing to 140's-150's when sitting up in bed and up to bathroom. Pt also requesting nicotine patch. See new orders.

## 2016-02-26 MED ORDER — CHLORDIAZEPOXIDE HCL 25 MG PO CAPS
25.0000 mg | ORAL_CAPSULE | Freq: Three times a day (TID) | ORAL | Status: DC
  Administered 2016-02-26 (×2): 25 mg via ORAL
  Filled 2016-02-26 (×2): qty 1

## 2016-02-26 MED ORDER — HALOPERIDOL 0.5 MG PO TABS
0.2500 mg | ORAL_TABLET | Freq: Two times a day (BID) | ORAL | Status: DC
  Administered 2016-02-26 (×2): 0.25 mg via ORAL
  Filled 2016-02-26 (×2): qty 1

## 2016-02-26 MED ORDER — DIPHENHYDRAMINE HCL 25 MG PO CAPS
25.0000 mg | ORAL_CAPSULE | Freq: Once | ORAL | Status: AC
  Administered 2016-02-26: 25 mg via ORAL
  Filled 2016-02-26: qty 1

## 2016-02-26 MED ORDER — NICOTINE POLACRILEX 2 MG MT GUM
2.0000 mg | CHEWING_GUM | OROMUCOSAL | Status: DC | PRN
  Administered 2016-02-26: 05:00:00 2 mg via ORAL
  Filled 2016-02-26: qty 2

## 2016-02-26 NOTE — Care Management (Signed)
Admitted to [facility] with the diagnosis of seizures related to alcohol withdrawal. Friend is mark Bender [phone]).  [facility] Multiple presentations to the emergency room. No insurance listed. No primary care physician listed.  Sleeping and sitter at the bedside. Gwenette GreetBrenda S Mackinley Kiehn RN MSN CCM Care Management [phone]

## 2016-02-26 NOTE — Progress Notes (Signed)
Dr. Tobi BastosPyreddy notified pt constantly jumping out of bed w/ unsteady gait w/ urgency to urinate and searching for cigarette despite nicotine patch in place. See new orders.

## 2016-02-26 NOTE — Progress Notes (Signed)
[facility] - [place] at [facility]   PATIENT NAME: Amanda Huff    MR#:  [id number]  DATE OF BIRTH:  1961/09/10  SUBJECTIVE:  CHIEF COMPLAINT:   Chief Complaint  Patient presents with  . Alcohol Intoxication   Tremor is better but still has anxiety. She was agitated last night, on sitter. REVIEW OF SYSTEMS:  CONSTITUTIONAL: No fever, fatigue or weakness.  EYES: No blurred or double vision.  EARS, NOSE, AND THROAT: No tinnitus or ear pain.  RESPIRATORY: No cough, shortness of breath, wheezing or hemoptysis.  CARDIOVASCULAR: No chest pain, orthopnea, edema.  GASTROINTESTINAL: No nausea, vomiting, diarrhea or abdominal pain.  GENITOURINARY: No dysuria, hematuria.  ENDOCRINE: No polyuria, nocturia,  HEMATOLOGY: No anemia, easy bruising or bleeding SKIN: No rash or lesion. MUSCULOSKELETAL: No joint pain or arthritis.   NEUROLOGIC: No tingling, numbness, weakness.  PSYCHIATRY: Tremor and anxiety. No depression.   DRUG ALLERGIES:  No Known Allergies  VITALS:  Blood pressure 134/84, pulse 93, temperature 98.8 F (37.1 C), temperature source Oral, resp. rate 18, height  (1.676 m), weight 64.093 kg (141 lb 4.8 oz), SpO2 98 %.  PHYSICAL EXAMINATION:  GENERAL:  55 y.o.-year-old patient lying in the bed with no acute distress.  EYES: Pupils equal, round, reactive to light and accommodation. No scleral icterus. Extraocular muscles intact.  HEENT: Head atraumatic, normocephalic. Oropharynx and nasopharynx clear.  NECK:  Supple, no jugular venous distention. No thyroid enlargement, no tenderness.  LUNGS: Normal breath sounds bilaterally, no wheezing, rales,rhonchi or crepitation. No use of accessory muscles of respiration.  CARDIOVASCULAR: S1, S2 normal. No murmurs, rubs, or gallops.  ABDOMEN: Soft, nontender, nondistended. Bowel sounds present. No organomegaly or mass.  EXTREMITIES: No pedal edema, cyanosis, or clubbing.  NEUROLOGIC: Cranial nerves II  through XII are intact. Muscle strength 5/5 in all extremities. Sensation intact. Gait not checked.  PSYCHIATRIC: The patient is alert and oriented x 3.  SKIN: No obvious rash, lesion, or ulcer.    LABORATORY PANEL:   CBC  Recent Labs Lab 02/25/16 0441  WBC 5.0  HGB 13.0  HCT 38.3  PLT 180   ------------------------------------------------------------------------------------------------------------------  Chemistries   Recent Labs Lab 02/24/16 1534  NA 133*  K 3.7  CL 93*  CO2 20*  GLUCOSE 125*  BUN 7  CREATININE 0.52  CALCIUM 8.7*  AST 113*  ALT 97*  ALKPHOS 98  BILITOT 1.5*   ------------------------------------------------------------------------------------------------------------------  Cardiac Enzymes No results for input(s): TROPONINI in the last 168 hours. ------------------------------------------------------------------------------------------------------------------  RADIOLOGY:  No results found.  EKG:   Orders placed or performed during the hospital encounter of 05/04/15  . EKG 12-Lead  . EKG 12-Lead  . EKG    ASSESSMENT AND PLAN:   #1. Alcohol withdrawal seizures: continue Ativan as per withdrawal protocol, on seizure precautions. Watch for her Delerium Tremens. F/u Psych consult.  continue ativan prn,  thiamine and folic acid.   #2 alcohol acute hepatitis: better.  3. Alcohol abuse with alcohol-induced mood disorder:  F/u psych consult.  4. Hyponatremia. Possible Due to alcohol.  * tobacco abuse.  smoking cessation was counseled for 3 min. On nicotine patch.  All the records are reviewed and case discussed with Care Management/Social Workerr. Management plans discussed with the patient, family and they are in agreement.  CODE STATUS: full code.  TOTAL TIME TAKING CARE OF THIS PATIENT: 33 minutes.  Greater than 50% time was spent on coordination of care and face-to-face counseling.  POSSIBLE D/C IN 2  DAYS, DEPENDING ON  CLINICAL CONDITION.   Shaune Pollackhen, Ishaaq Penna M.D on 02/26/2016 at 2:50 PM  Between 7am to 6pm - Pager - [phone]  After 6pm go to [email or web address] - password [email or web address]  [facility]  Office  [phone]  CC: Primary care physician; No PCP Per Patient

## 2016-02-27 LAB — URINALYSIS COMPLETE WITH MICROSCOPIC (ARMC ONLY)
BACTERIA UA: NONE SEEN
Bilirubin Urine: NEGATIVE
GLUCOSE, UA: NEGATIVE mg/dL
Hgb urine dipstick: NEGATIVE
Ketones, ur: NEGATIVE mg/dL
Leukocytes, UA: NEGATIVE
NITRITE: NEGATIVE
Protein, ur: 100 mg/dL — AB
SPECIFIC GRAVITY, URINE: 1.005 (ref 1.005–1.030)
pH: 8 (ref 5.0–8.0)

## 2016-02-27 NOTE — Discharge Summary (Signed)
[facility] - [place] at [facility]   PATIENT NAME: Amanda SimsLisa Huff    MR#:  [id number]  DATE OF BIRTH:  07-21-1961  DATE OF ADMISSION:  02/24/2016 ADMITTING PHYSICIAN: Auburn BilberryShreyang Patel, MD  DATE OF DISCHARGE: 02/27/2016  8:09 AM  PRIMARY CARE PHYSICIAN: No PCP Per Patient    ADMISSION DIAGNOSIS:  Alcohol intoxication, uncomplicated (HCC) [F10.120] Withdrawal seizures, with delirium (HCC) [F19.231]   DISCHARGE DIAGNOSIS:    SECONDARY DIAGNOSIS:   Past Medical History  Diagnosis Date  . ETOH abuse   . Arthritis     HOSPITAL COURSE:   #1. Alcohol withdrawal seizures: She was on withdrawal protocol and seizure precautions. She was treated with ativan prn, thiamine and folic acid. No evidence of DT. No seizure after admission.   #2 alcohol acute hepatitis: better.  3. Alcohol abuse with alcohol-induced mood disorder: I advised the patient to get detoxed as outpatient.  4. Hyponatremia. Possible Due to alcohol.  * tobacco abuse. smoking cessation was counseled for 3 min. On nicotine patch.  The patient wanted to go home and left before psych consult.  DISCHARGE CONDITIONS:   Stable, discharge to home today.  CONSULTS OBTAINED:  Treatment Team:  Brandy HaleUzma Faheem, MD  DRUG ALLERGIES:  No Known Allergies  DISCHARGE MEDICATIONS:   Discharge Medication List as of 02/24/2016  6:09 AM    CONTINUE these medications which have NOT CHANGED   Details  hydrOXYzine (ATARAX/VISTARIL) 25 MG tablet Take 1 tablet (25 mg total) by mouth every 6 (six) hours as needed., Starting 02/18/2016, Until Discontinued, Print    mirtazapine (REMERON) 7.5 MG tablet Take 1 tablet (7.5 mg total) by mouth at bedtime. For insomnia, Starting 02/18/2016, Until Discontinued, Print    risperiDONE (RISPERDAL) 0.5 MG tablet Take 1 tablet (0.5 mg total) by mouth 2 (two) times daily. For mood control, Starting 02/18/2016, Until Discontinued, Print    traZODone (DESYREL) 50 MG tablet  Take 1 tablet (50 mg total) by mouth at bedtime as needed for sleep., Starting 02/18/2016, Until Discontinued, Print         DISCHARGE INSTRUCTIONS:     If you experience worsening of your admission symptoms, develop shortness of breath, life threatening emergency, suicidal or homicidal thoughts you must seek medical attention immediately by calling 911 or calling your MD immediately  if symptoms less severe.  You Must read complete instructions/literature along with all the possible adverse reactions/side effects for all the Medicines you take and that have been prescribed to you. Take any new Medicines after you have completely understood and accept all the possible adverse reactions/side effects.   Please note  You were cared for by a hospitalist during your hospital stay. If you have any questions about your discharge medications or the care you received while you were in the hospital after you are discharged, you can call the unit and asked to speak with the hospitalist on call if the hospitalist that took care of you is not available. Once you are discharged, your primary care physician will handle any further medical issues. Please note that NO REFILLS for any discharge medications will be authorized once you are discharged, as it is imperative that you return to your primary care physician (or establish a relationship with a primary care physician if you do not have one) for your aftercare needs so that they can reassess your need for medications and monitor your lab values.    Today   SUBJECTIVE   No complaint.  VITAL SIGNS:  Blood pressure 120/75, pulse 95, temperature 98.2 F (36.8 C), temperature source Oral, resp. rate 20, height  (1.676 m), weight 64.093 kg (141 lb 4.8 oz), SpO2 98 %.  I/O:   Intake/Output Summary (Last 24 hours) at 02/27/16 1817 Last data filed at 02/27/16 0344  Gross per 24 hour  Intake      0 ml  Output    100 ml  Net   -100 ml     PHYSICAL EXAMINATION:  GENERAL:  55 y.o.-year-old patient lying in the bed with no acute distress.  EYES: Pupils equal, round, reactive to light and accommodation. No scleral icterus. Extraocular muscles intact.  HEENT: Head atraumatic, normocephalic. Oropharynx and nasopharynx clear.  NECK:  Supple, no jugular venous distention. No thyroid enlargement, no tenderness.  LUNGS: Normal breath sounds bilaterally, no wheezing, rales,rhonchi or crepitation. No use of accessory muscles of respiration.  CARDIOVASCULAR: S1, S2 normal. No murmurs, rubs, or gallops.  ABDOMEN: Soft, non-tender, non-distended. Bowel sounds present. No organomegaly or mass.  EXTREMITIES: No pedal edema, cyanosis, or clubbing.  NEUROLOGIC: Cranial nerves II through XII are intact. Muscle strength 5/5 in all extremities. Sensation intact. Gait not checked.  PSYCHIATRIC: The patient is alert and oriented x 3.  SKIN: No obvious rash, lesion, or ulcer.   DATA REVIEW:   CBC  Recent Labs Lab 02/25/16 0441  WBC 5.0  HGB 13.0  HCT 38.3  PLT 180    Chemistries   Recent Labs Lab 02/24/16 1534  NA 133*  K 3.7  CL 93*  CO2 20*  GLUCOSE 125*  BUN 7  CREATININE 0.52  CALCIUM 8.7*  AST 113*  ALT 97*  ALKPHOS 98  BILITOT 1.5*    Cardiac Enzymes No results for input(s): TROPONINI in the last 168 hours.  Microbiology Results  No results found for this or any previous visit.  RADIOLOGY:  No results found.      Management plans discussed with the patient, family and they are in agreement.  CODE STATUS:  Code Status History    Date Active Date Inactive Code Status Order ID Comments User Context   02/24/2016  4:03 PM 02/27/2016 11:09 AM Full Code [id number]  Katha Hamming, MD ED   02/24/2016  6:59 AM 02/24/2016  4:03 PM Full Code [id number]  Darci Current, MD ED   02/17/2016  9:26 PM 02/18/2016  5:05 PM Full Code [id number]  Kerry Hough, PA-C Inpatient   02/17/2016  5:45 AM 02/17/2016  9:26 PM  Full Code [id number]  Jeanmarie Plant, MD ED   11/11/2015  4:12 PM 11/24/2015  5:33 PM Full Code [id number]  Earney Navy, NP Inpatient   11/10/2015 11:36 PM 11/11/2015  4:12 PM Full Code [id number]  Lorre Nick, MD ED   10/29/2015  3:30 AM 11/01/2015  1:11 AM Full Code [id number]  Kerry Hough, PA-C Inpatient   10/27/2015  6:46 PM 10/29/2015  3:30 AM Full Code [id number]  Arby Barrette, MD ED      TOTAL TIME TAKING CARE OF THIS PATIENT: 32 minutes.    Shaune Pollack M.D on 02/27/2016 at 6:17 PM  Between 7am to 6pm - Pager - [phone]  After 6pm go to [email or web address] - password [email or web address] [facility]  [place] [facility]  Office  [phone]  CC: Primary care physician; No PCP Per Patient

## 2016-02-27 NOTE — Progress Notes (Signed)
Patient adamant regarding leaving. After speaking with patient, explaining the need to stay for care, She refuses. Dr. Imogene Burnhen paged regarding situation. Dr. Imogene Burnhen had RN to speak with patient explaining that he would come and see her. Still refusing. Patient signed AMA form. Patient requesting wheelchair. States" I have already called a cab." Assisted via wheelchair to visitors entrance.

## 2016-02-27 NOTE — Discharge Instructions (Signed)
Alcohol Intoxication Alcohol intoxication occurs when the amount of alcohol that a person has consumed impairs his or her ability to mentally and physically function. Alcohol directly impairs the normal chemical activity of the brain. Drinking large amounts of alcohol can lead to changes in mental function and behavior, and it can cause many physical effects that can be harmful.  Alcohol intoxication can range in severity from mild to very severe. Various factors can affect the level of intoxication that occurs, such as the person's age, gender, weight, frequency of alcohol consumption, and the presence of other medical conditions (such as diabetes, seizures, or heart conditions). Dangerous levels of alcohol intoxication may occur when people drink large amounts of alcohol in a short period (binge drinking). Alcohol can also be especially dangerous when combined with certain prescription medicines or "recreational" drugs. SIGNS AND SYMPTOMS Some common signs and symptoms of mild alcohol intoxication include:  Loss of coordination.  Changes in mood and behavior.  Impaired judgment.  Slurred speech. As alcohol intoxication progresses to more severe levels, other signs and symptoms will appear. These may include:  Vomiting.  Confusion and impaired memory.  Slowed breathing.  Seizures.  Loss of consciousness. DIAGNOSIS  Your health care provider will take a medical history and perform a physical exam. You will be asked about the amount and type of alcohol you have consumed. Blood tests will be done to measure the concentration of alcohol in your blood. In many places, your blood alcohol level must be lower than 80 mg/dL (4.09%0.08%) to legally drive. However, many dangerous effects of alcohol can occur at much lower levels.  TREATMENT  People with alcohol intoxication often do not require treatment. Most of the effects of alcohol intoxication are temporary, and they go away as the alcohol naturally  leaves the body. Your health care provider will monitor your condition until you are stable enough to go home. Fluids are sometimes given through an IV access tube to help prevent dehydration.  HOME CARE INSTRUCTIONS  Do not drive after drinking alcohol.  Stay hydrated. Drink enough water and fluids to keep your urine clear or pale yellow. Avoid caffeine.   Only take over-the-counter or prescription medicines as directed by your health care provider.  SEEK MEDICAL CARE IF:   You have persistent vomiting.   You do not feel better after a few days.  You have frequent alcohol intoxication. Your health care provider can help determine if you should see a substance use treatment counselor. SEEK IMMEDIATE MEDICAL CARE IF:   You become shaky or tremble when you try to stop drinking.   You shake uncontrollably (seizure).   You throw up (vomit) blood. This may be bright red or may look like black coffee grounds.   You have blood in your stool. This may be bright red or may appear as a black, tarry, bad smelling stool.   You become lightheaded or faint.  MAKE SURE YOU:   Understand these instructions.  Will watch your condition.  Will get help right away if you are not doing well or get worse.   This information is not intended to replace advice given to you by your health care provider. Make sure you discuss any questions you have with your health care provider.   Document Released: 08/25/2005 Document Revised: 07/18/2013 Document Reviewed: 04/20/2013 Elsevier Interactive Patient Education 2016 Elsevier Inc.   Heart healthy diet Activity as tolerated. Smoking cessation. Outpatient detox.

## 2016-03-06 ENCOUNTER — Encounter: Payer: Self-pay | Admitting: Emergency Medicine

## 2016-03-06 ENCOUNTER — Emergency Department
Admission: EM | Admit: 2016-03-06 | Discharge: 2016-03-06 | Disposition: A | Discharge status: Home / Self Care | Payer: MEDICAID | Attending: Emergency Medicine | Admitting: Emergency Medicine

## 2016-03-06 DIAGNOSIS — G8929 Other chronic pain: Secondary | ICD-10-CM | POA: Insufficient documentation

## 2016-03-06 DIAGNOSIS — F17210 Nicotine dependence, cigarettes, uncomplicated: Secondary | ICD-10-CM | POA: Insufficient documentation

## 2016-03-06 DIAGNOSIS — F1994 Other psychoactive substance use, unspecified with psychoactive substance-induced mood disorder: Secondary | ICD-10-CM

## 2016-03-06 DIAGNOSIS — F10920 Alcohol use, unspecified with intoxication, uncomplicated: Secondary | ICD-10-CM

## 2016-03-06 DIAGNOSIS — F10120 Alcohol abuse with intoxication, uncomplicated: Secondary | ICD-10-CM | POA: Insufficient documentation

## 2016-03-06 DIAGNOSIS — M25552 Pain in left hip: Secondary | ICD-10-CM | POA: Insufficient documentation

## 2016-03-06 LAB — COMPREHENSIVE METABOLIC PANEL
ALT: 121 U/L — ABNORMAL HIGH (ref 14–54)
ANION GAP: 10 (ref 5–15)
AST: 61 U/L — AB (ref 15–41)
Albumin: 4.3 g/dL (ref 3.5–5.0)
Alkaline Phosphatase: 99 U/L (ref 38–126)
BUN: 14 mg/dL (ref 6–20)
CHLORIDE: 103 mmol/L (ref 101–111)
CO2: 28 mmol/L (ref 22–32)
Calcium: 9.5 mg/dL (ref 8.9–10.3)
Creatinine, Ser: 0.59 mg/dL (ref 0.44–1.00)
GFR calc Af Amer: >60 mL/min (ref >60)
Glucose, Bld: 131 mg/dL — ABNORMAL HIGH (ref 65–99)
POTASSIUM: 4.6 mmol/L (ref 3.5–5.1)
Sodium: 141 mmol/L (ref 135–145)
Total Bilirubin: 0.4 mg/dL (ref 0.3–1.2)
Total Protein: 7.9 g/dL (ref 6.5–8.1)

## 2016-03-06 LAB — URINE DRUG SCREEN, QUALITATIVE (ARMC ONLY)
AMPHETAMINES, UR SCREEN: NOT DETECTED
Barbiturates, Ur Screen: NOT DETECTED
Benzodiazepine, Ur Scrn: POSITIVE — AB
COCAINE METABOLITE, UR ~~LOC~~: NOT DETECTED
Cannabinoid 50 Ng, Ur ~~LOC~~: NOT DETECTED
MDMA (ECSTASY) UR SCREEN: NOT DETECTED
METHADONE SCREEN, URINE: NOT DETECTED
OPIATE, UR SCREEN: NOT DETECTED
Phencyclidine (PCP) Ur S: NOT DETECTED
TRICYCLIC, UR SCREEN: NOT DETECTED

## 2016-03-06 LAB — ETHANOL: ALCOHOL ETHYL (B): 265 mg/dL — AB (ref <5)

## 2016-03-06 LAB — CBC
HEMATOCRIT: 43 % (ref 35.0–47.0)
Hemoglobin: 14.5 g/dL (ref 12.0–16.0)
MCH: 31.4 pg (ref 26.0–34.0)
MCHC: 33.6 g/dL (ref 32.0–36.0)
MCV: 93.4 fL (ref 80.0–100.0)
Platelets: 409 10*3/uL (ref 150–440)
RBC: 4.6 MIL/uL (ref 3.80–5.20)
RDW: 17.2 % — ABNORMAL HIGH (ref 11.5–14.5)
WBC: 6 10*3/uL (ref 3.6–11.0)

## 2016-03-06 MED ORDER — LORAZEPAM 2 MG PO TABS: 0.0000 mg | ORAL_TABLET | Freq: Two times a day (BID) | ORAL | Status: DC

## 2016-03-06 MED ORDER — FOLIC ACID 1 MG PO TABS
1.0000 mg | ORAL_TABLET | Freq: Once | ORAL | Status: AC
  Administered 2016-03-06: 1 mg via ORAL
  Filled 2016-03-06: qty 1

## 2016-03-06 MED ORDER — VITAMIN B-1 100 MG PO TABS
100.0000 mg | ORAL_TABLET | Freq: Once | ORAL | Status: AC
  Administered 2016-03-06: 100 mg via ORAL
  Filled 2016-03-06: qty 1

## 2016-03-06 MED ORDER — LORAZEPAM 2 MG PO TABS
0.0000 mg | ORAL_TABLET | Freq: Four times a day (QID) | ORAL | Status: DC
  Administered 2016-03-06: 1 mg via ORAL
  Filled 2016-03-06: qty 1

## 2016-03-06 NOTE — Discharge Instructions (Signed)
You were seen in the emergency department for alcohol intoxication.  Please seek help from the recommended resources for assistance with your alcohol dependence.  If you have any thoughts of hurting herself or others, please call 911 or return to the emergency department.  Please avoid drug and alcohol use.  Never drive a vehicle or operate machinery while intoxicated. ° ° °Alcohol Intoxication °Alcohol intoxication occurs when the amount of alcohol that a person has consumed impairs his or her ability to mentally and physically function. Alcohol directly impairs the normal chemical activity of the brain. Drinking large amounts of alcohol can lead to changes in mental function and behavior, and it can cause many physical effects that can be harmful.  °Alcohol intoxication can range in severity from mild to very severe. Various factors can affect the level of intoxication that occurs, such as the person's age, gender, weight, frequency of alcohol consumption, and the presence of other medical conditions (such as diabetes, seizures, or heart conditions). Dangerous levels of alcohol intoxication may occur when people drink large amounts of alcohol in a short period (binge drinking). Alcohol can also be especially dangerous when combined with certain prescription medicines or "recreational" drugs. °SIGNS AND SYMPTOMS °Some common signs and symptoms of mild alcohol intoxication include: °Loss of coordination. °Changes in mood and behavior. °Impaired judgment. °Slurred speech. °As alcohol intoxication progresses to more severe levels, other signs and symptoms will appear. These may include: °Vomiting. °Confusion and impaired memory. °Slowed breathing. °Seizures. °Loss of consciousness. °DIAGNOSIS  °Your health care provider will take a medical history and perform a physical exam. You will be asked about the amount and type of alcohol you have consumed. Blood tests will be done to measure the concentration of alcohol in  your blood. In many places, your blood alcohol level must be lower than 80 mg/dL (0.08%) to legally drive. However, many dangerous effects of alcohol can occur at much lower levels.  °TREATMENT  °People with alcohol intoxication often do not require treatment. Most of the effects of alcohol intoxication are temporary, and they go away as the alcohol naturally leaves the body. Your health care provider will monitor your condition until you are stable enough to go home. Fluids are sometimes given through an IV access tube to help prevent dehydration.  °HOME CARE INSTRUCTIONS °Do not drive after drinking alcohol. °Stay hydrated. Drink enough water and fluids to keep your urine clear or pale yellow. Avoid caffeine.   °Only take over-the-counter or prescription medicines as directed by your health care provider.   °SEEK MEDICAL CARE IF:  °You have persistent vomiting.   °You do not feel better after a few days. °You have frequent alcohol intoxication. Your health care provider can help determine if you should see a substance use treatment counselor. °SEEK IMMEDIATE MEDICAL CARE IF:  °You become shaky or tremble when you try to stop drinking.   °You shake uncontrollably (seizure).   °You throw up (vomit) blood. This may be bright red or may look like black coffee grounds.   °You have blood in your stool. This may be bright red or may appear as a black, tarry, bad smelling stool.   °You become lightheaded or faint.   °MAKE SURE YOU:  °Understand these instructions. °Will watch your condition. °Will get help right away if you are not doing well or get worse. °Document Released: 08/25/2005 Document Revised: 07/18/2013 Document Reviewed: 04/20/2013 °Exit[employer]® Patient Information ©2015 [employer]. This information is not intended to replace advice   given to you by your health care provider. Make sure you discuss any questions you have with your health care provider. ° °Finding Treatment for Alcohol and Drug Addiction °It can  be hard to find the right place to get professional treatment. Here are some important things to consider: °There are different types of treatment to choose from. °Some programs are live-in (residential) while others are not (outpatient). Sometimes a combination is offered. °No single type of program is right for everyone. °Most treatment programs involve a combination of education, counseling, and a 12-step, spiritually-based approach. °There are non-spiritually based programs (not 12-step). °Some treatment programs are government sponsored. They are geared for patients without private insurance. °Treatment programs can vary in many respects such as: °Cost and types of insurance accepted. °Types of on-site medical services offered. °Length of stay, setting, and size. °Overall philosophy of treatment.  °A person may need specialized treatment or have needs not addressed by all programs. For example, adolescents need treatment appropriate for their age. Other people have secondary disorders that must be managed as well. Secondary conditions can include mental illness, such as depression or diabetes. Often, a period of detoxification from alcohol or drugs is needed. This requires medical supervision and not all programs offer this. °THINGS TO CONSIDER WHEN SELECTING A TREATMENT PROGRAM  °Is the program certified by the appropriate government agency? Even private programs must be certified and employ certified professionals. °Does the program accept your insurance? If not, can a payment plan be set up? °Is the facility clean, organized, and well run? Do they allow you to speak with graduates who can share their treatment experience with you? Can you tour the facility? Can you meet with staff? °Does the program meet the full range of individual needs? °Does the treatment program address sexual orientation and physical disabilities? Do they provide age, gender, and culturally appropriate treatment services? °Is treatment  available in languages other than English? °Is long-term aftercare support or guidance encouraged and provided? °Is assessment of an individual's treatment plan ongoing to ensure it meets changing needs? °Does the program use strategies to encourage reluctant patients to remain in treatment long enough to increase the likelihood of success? °Does the program offer counseling (individual or group) and other behavioral therapies? °Does the program offer medicine as part of the treatment regimen, if needed? °Is there ongoing monitoring of possible relapse? Is there a defined relapse prevention program? Are services or referrals offered to family members to ensure they understand addiction and the recovery process? This would help them support the recovering individual. °Are 12-step meetings held at the center or is transport available for patients to attend outside meetings? °In countries outside of the [place] and [place], see local directories for contact information for services in your area. °Document Released: 10/14/2005 Document Revised: 02/07/2012 Document Reviewed: 04/25/2008 °Exit[employer]® Patient Information ©2015 [employer]. This information is not intended to replace advice given to you by your health care provider. Make sure you discuss any questions you have with your health care provider. ° ° °

## 2016-03-06 NOTE — ED Notes (Signed)
Per EMS patient has been drinking vodka since yesterday.  Patient states it was about half a gallon.  She presents cooperative and saying she is having Hip Pain and that she is not here for Depression like EMS says she is.  She states she drinks a half of vodka about "twice a month"  She is asking for food and something to drink while being triaged.

## 2016-03-06 NOTE — ED Provider Notes (Signed)
[facility] Emergency Department Provider Note  ____________________________________________  Time seen: Approximately 5:16 AM  I have reviewed the triage vital signs and the nursing notes.   HISTORY  Chief Complaint Depression and Alcohol Intoxication  History is Limited by alcohol intoxication  HPI Amanda Huff is a 55 y.o. female with a past medical history of chronic alcohol abuse and admission last week for complicated alcohol withdrawal including seizures and delirium who presents by EMS for evaluation of depression and alcohol intoxication.I spoke with her after her arrival and she says that she is not too sure why she called.  She has chronic pain in her left hip that has been bothering her after a recent fall.  Mostly she is depressed that she continues to drink heavily every day in spite of her recent admission for alcohol detox.  As soon as she left the hospital, she started drinking again, and reports that she drinks 1/2 gallon of vodka per day.   Onset of symptoms has been gradual and is severe.  She adamantly denies SI/HI.   Past Medical History  Diagnosis Date  . ETOH abuse   . Arthritis     Patient Active Problem List   Diagnosis Date Noted  . Seizure due to alcohol withdrawal (HCC) 02/24/2016  . MDD (major depressive disorder), recurrent severe, without psychosis (HCC) 02/18/2016  . Substance induced mood disorder (HCC) 02/17/2016  . Alcohol withdrawal (HCC) 02/17/2016  . Arthritis 02/17/2016  . 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 Alcohol use disorder, severe, dependence (HCC) 11/11/2015  . Alcohol abuse with alcohol-induced mood disorder (HCC) 10/29/2015  . Depression, major, recurrent, moderate (HCC) 10/29/2015    Past Surgical History  Procedure Laterality Date  . Cesarean section      Current Outpatient Rx  Name  Route  Sig  Dispense  Refill  . hydrOXYzine (ATARAX/VISTARIL) 25 MG tablet    Oral   Take 1 tablet (25 mg total) by mouth every 6 (six) hours as needed. Patient taking differently: Take 25 mg by mouth every 6 (six) hours as needed for anxiety.    21 tablet   0   . mirtazapine (REMERON) 7.5 MG tablet   Oral   Take 1 tablet (7.5 mg total) by mouth at bedtime. For insomnia   14 tablet   0   . risperiDONE (RISPERDAL) 0.5 MG tablet   Oral   Take 1 tablet (0.5 mg total) by mouth 2 (two) times daily. For mood control   28 tablet   0   . traZODone (DESYREL) 50 MG tablet   Oral   Take 1 tablet (50 mg total) by mouth at bedtime as needed for sleep.   14 tablet   0     Allergies Review of patient's allergies indicates no known allergies.  History reviewed. No pertinent family history.  Social History Social History  Substance Use Topics  . Smoking status: Current Every Day Smoker -- 0.50 packs/day    Types: Cigarettes  . Smokeless tobacco: None  . Alcohol Use: Yes     Comment: 1 bottle of vodka every 3 days    Review of Systems (limited by ETOH intoxication) Constitutional: No fever/chills Eyes: No visual changes. ENT: No sore throat. Cardiovascular: Denies chest pain. Respiratory: Denies shortness of breath. Gastrointestinal: No abdominal pain.  No nausea, no vomiting.  No diarrhea.  No constipation. Genitourinary: Negative for dysuria. Musculoskeletal: Negative for back pain. Chronic pain in left hip. Skin: Negative  for rash. Neurological: Negative for headaches, focal weakness or numbness. Psych:  Depressed and endorses alcoholism but denies SI/HI  10-point ROS otherwise negative.  ____________________________________________   PHYSICAL EXAM:  VITAL SIGNS: ED Triage Vitals  Enc Vitals Group     BP 03/06/16 0355 139/89 mmHg     Pulse Rate 03/06/16 0355 102     Resp 03/06/16 0355 16     Temp 03/06/16 0355 98.6 F (37 C)     Temp Source 03/06/16 0355 Oral     SpO2 03/06/16 0355 92 %     Weight --      Height --      Head Cir --       Peak Flow --      Pain Score 03/06/16 0335 5     Pain Loc --      Pain Edu? --      Excl. in GC? --     Constitutional: Alert and oriented. Well appearing and in no acute distress. Eyes: Conjunctivae are normal. PERRL. EOMI. Head: Atraumatic. Nose: No congestion/rhinnorhea. Mouth/Throat: Mucous membranes are moist.  Oropharynx non-erythematous. Neck: No stridor.  No meningeal signs.   Cardiovascular: Normal rate, regular rhythm. Good peripheral circulation. Grossly normal heart sounds.   Respiratory: Normal respiratory effort.  No retractions. Lungs CTAB. Gastrointestinal: Soft and nontender. No distention.  Musculoskeletal: No lower extremity tenderness nor edema. No gross deformities of extremities.  Pelvis stable, patient ambulating and weight-bearing without difficulty Neurologic:  Normal speech and language. No gross focal neurologic deficits are appreciated.  Skin:  Skin is warm, dry and intact. Abrasion to bridge of nose Psychiatric: Mood and affect are depressed, but adamantly denies SI/HI  ____________________________________________   LABS (all labs ordered are listed, but only abnormal results are displayed)  Labs Reviewed  COMPREHENSIVE METABOLIC PANEL - Abnormal; Notable for the following:    Glucose, Bld 131 (*)    AST 61 (*)    ALT 121 (*)    All other components within normal limits  ETHANOL - Abnormal; Notable for the following:    Alcohol, Ethyl (B) 265 (*)    All other components within normal limits  CBC - Abnormal; Notable for the following:    RDW 17.2 (*)    All other components within normal limits  URINE DRUG SCREEN, QUALITATIVE ([facility] ONLY) - Abnormal; Notable for the following:    Benzodiazepine, Ur Scrn POSITIVE (*)    All other components within normal limits   ____________________________________________  EKG  None ____________________________________________  RADIOLOGY   No results  found.  ____________________________________________   PROCEDURES  Procedure(s) performed: None  Critical Care performed: No ____________________________________________   INITIAL IMPRESSION / ASSESSMENT AND PLAN / ED COURSE  Pertinent labs & imaging results that were available during my care of the patient were reviewed by me and considered in my medical decision making (see chart for details).  No indication for IVC.  Putting on CIWA, consulting TTS.  No acute medical issues.  ----------------------------------------- 6:27 AM on 03/06/2016 -----------------------------------------  Roxana with TTS evaluated the patient and agrees with me that there is nothing specific or acute that the patient would benefit from.  The patient is not interested in detox at this time.  She is ambulatory without any difficulty and speaking with no slurred speech or confusion.  I will discharge her for outpatient follow-up.  She can have a sober adult come pick her up or take a taxi home.  She was given outpatient resources  and my usual and customary return precautions.  ____________________________________________  FINAL CLINICAL IMPRESSION(S) / ED DIAGNOSES  Final diagnoses:  Alcohol intoxication, uncomplicated (HCC)  Substance induced mood disorder (HCC)      NEW MEDICATIONS STARTED DURING THIS VISIT:  New Prescriptions   No medications on file      Note:  This document was prepared using Dragon voice recognition software and may include unintentional dictation errors.   Loleta Roseory Darlene Brozowski, MD 03/06/16 [id number]

## 2016-03-06 NOTE — BHH Counselor (Signed)
Patient presents via EMS for alcohol intoxication.  Pt reports she has been drinking a half gallon of vodka a day.  She denies SI/HI or any auditory/visual hallucinations.  She says that she presented to the ED for hip pain instead of depression but then reports that she is depressed.  She states that her daughter just moved back to [place] and pt states she is alone and has no friends and family in [place].  She states that moving to [place] is not an option for her. She says that she drinks to cope with her depression.  TTS offered supportive counseling and resources for both inpatient and outpatient therapy.  Pt reports she is not interested in getting any counseling at the present time.

## 2016-03-11 NOTE — Discharge Summary (Signed)
[facility] OBS UNIT DISCHARGE SUMMARY  Patient Identification: Amanda Huff MRN:  [id number] Date of Evaluation:  02/18/2016 Chief Complaint:  "I need help with my alcohol" Principal Diagnosis: MDD (major depressive disorder), recurrent severe, without psychosis [facility])  Patient Active Problem List   Diagnosis Date Noted  . Alcohol abuse with alcohol-induced mental disorder [facility]) [F10.988] 02/17/2016    Priority: High  . Alcohol use disorder, severe, dependence ([place]) [F10.20] 11/11/2015    Priority: High  . Alcohol abuse with alcohol-induced mood disorder ([place]) [F10.14] 10/29/2015    Priority: High  . Depression, major, recurrent, moderate ([place]) [F33.1] 10/29/2015    Priority: High  . MDD (major depressive disorder), recurrent severe, without psychosis ([place]) [F33.2] 02/18/2016  . Substance induced mood disorder ([place]) [F19.94] 02/17/2016  . Alcohol withdrawal ([place]) [F10.239] 02/17/2016  . Arthritis [M19.90] 02/17/2016   History of Present Illness: Patient is a 55 y/o female with long standing history of Alcohol abuse, presenting to [facility] ED acutely intoxicated as she reportedly drank 750 ML of Vodka. Patient has a prior history of ETOH abuse but yesterdays reported consumption is more than usual. Review of prior records per [facility] reveal the following, Patient says she has been treated for depression in the past and remembers having taken Prozac before but doesn't think it was ever helpful. She denies ever trying to kill her self. She says she's had one psychiatric hospitalization in the past for depression and she may be referring to the hospitalization just this past December at [facility] behavioral health. Denies ever having tried to harm herself in the past. Denies any history of mania or psychosis.  Depression Symptoms:  depressed mood, hopelessness, (Hypo) Manic Symptoms:  denies Anxiety Symptoms:  Excessive Worry, Psychotic Symptoms:  denies PTSD Symptoms: Negative Total Time spent with patient:  30 minutes  Past Psychiatric History: ( See HPI)  Is the patient at risk to self? No.  Has the patient been a risk to self in the past 6 months? No.  Has the patient been a risk to self within the distant past? No.  Is the patient a risk to others? No.  Has the patient been a risk to others in the past 6 months? No.  Has the patient been a risk to others within the distant past? No.   Prior Inpatient Therapy:   Prior Outpatient Therapy:    Alcohol Screening: Patient refused Alcohol Screening Tool: Yes 1. How often do you have a drink containing alcohol?: 4 or more times a week 2. How many drinks containing alcohol do you have on a typical day when you are drinking?: 5 or 6 3. How often do you have six or more drinks on one occasion?: Never Preliminary Score: 2 7. How often during the last year have you had a feeling of guilt of remorse after drinking?: Monthly 8. How often during the last year have you been unable to remember what happened the night before because you had been drinking?: Monthly 9. Have you or someone else been injured as a result of your drinking?: No 10. Has a relative or friend or a doctor or another health worker been concerned about your drinking or suggested you cut down?: Yes, but not in the last year Alcohol Use Disorder Identification Test Final Score (AUDIT): 12 Brief Intervention: Patient declined brief intervention Substance Abuse History in the last 12 months:  Yes.   Consequences of Substance Abuse: Medical Consequences:  failing health Previous Psychotropic Medications: Yes  Psychological Evaluations: Yes  Past Medical History:  Past Medical History  Diagnosis Date  . ETOH abuse   . Arthritis     Past Surgical History  Procedure Laterality Date  . Cesarean section     Family History: History reviewed. No pertinent family history. Family Psychiatric  History: unremarkable Tobacco Screening: 1/2 PPD smoker Social History: Single History   Alcohol Use  . Yes    Comment: 1 bottle of vodka every 3 days     History  Drug Use No    Additional Social History:      Pain Medications: See PTA medication  Prescriptions: See PTA medication list Over the Counter: See PTA medication list History of alcohol / drug use?: Yes Longest period of sobriety (when/how long): Unknown Negative Consequences of Use: Personal relationships Withdrawal Symptoms: Agitation, Sweats, Nausea / Vomiting, Irritability, Tremors Name of Substance 1: Alcohol 1 - Age of First Use: 16 1 - Amount (size/oz): about a bottle of vodka a week 1 - Frequency: 3 days a week 1 - Last Use / Amount: 02/16/2016                  Allergies:  No Known Allergies Lab Results:  Results for orders placed or performed during the hospital encounter of 02/16/16 (from the past 48 hour(s))  CBC     Status: Abnormal   Collection Time: 02/16/16  8:23 PM  Result Value Ref Range   WBC 4.2 3.6 - 11.0 K/uL   RBC 4.57 3.80 - 5.20 MIL/uL   Hemoglobin 14.1 12.0 - 16.0 g/dL   HCT 41.7 35.0 - 47.0 %   MCV 91.3 80.0 - 100.0 fL   MCH 30.8 26.0 - 34.0 pg   MCHC 33.7 32.0 - 36.0 g/dL   RDW 15.4 (H) 11.5 - 14.5 %   Platelets 170 150 - 440 K/uL  Comprehensive metabolic panel     Status: Abnormal   Collection Time: 02/16/16  8:23 PM  Result Value Ref Range   Sodium 135 135 - 145 mmol/L    Comment: LYTES REPEATED   Potassium 3.8 3.5 - 5.1 mmol/L   Chloride 92 (L) 101 - 111 mmol/L   CO2 23 22 - 32 mmol/L   Glucose, Bld 104 (H) 65 - 99 mg/dL   BUN 9 6 - 20 mg/dL   Creatinine, Ser 0.57 0.44 - 1.00 mg/dL   Calcium 8.6 (L) 8.9 - 10.3 mg/dL   Total Protein 6.7 6.5 - 8.1 g/dL   Albumin 3.9 3.5 - 5.0 g/dL   AST 244 (H) 15 - 41 U/L   ALT 151 (H) 14 - 54 U/L   Alkaline Phosphatase 97 38 - 126 U/L   Total Bilirubin 1.3 (H) 0.3 - 1.2 mg/dL   GFR calc non Af Amer >60 >60 mL/min   GFR calc Af Amer >60 >60 mL/min    Comment: (NOTE) The eGFR has been calculated using the CKD EPI  equation. This calculation has not been validated in all clinical situations. eGFR's persistently <60 mL/min signify possible Chronic Kidney Disease.    Anion gap 20 (H) 5 - 15  Ethanol     Status: Abnormal   Collection Time: 02/16/16  8:23 PM  Result Value Ref Range   Alcohol, Ethyl (B) 336 (HH) <5 mg/dL    Comment: CRITICAL RESULT CALLED TO, READ BACK BY AND VERIFIED WITH EMMA HUNTER AT 2126 02/16/16 MLZ        LOWEST DETECTABLE LIMIT FOR SERUM ALCOHOL IS 5 mg/dL FOR  MEDICAL PURPOSES ONLY   Acetaminophen level     Status: Abnormal   Collection Time: 02/16/16  8:23 PM  Result Value Ref Range   Acetaminophen (Tylenol), Serum <10 (L) 10 - 30 ug/mL    Comment:        THERAPEUTIC CONCENTRATIONS VARY SIGNIFICANTLY. A RANGE OF 10-30 ug/mL MAY BE AN EFFECTIVE CONCENTRATION FOR MANY PATIENTS. HOWEVER, SOME ARE BEST TREATED AT CONCENTRATIONS OUTSIDE THIS RANGE. ACETAMINOPHEN CONCENTRATIONS >150 ug/mL AT 4 HOURS AFTER INGESTION AND >50 ug/mL AT 12 HOURS AFTER INGESTION ARE OFTEN ASSOCIATED WITH TOXIC REACTIONS.   Salicylate level     Status: None   Collection Time: 02/16/16  8:23 PM  Result Value Ref Range   Salicylate Lvl <8.2 2.8 - 30.0 mg/dL  Urine Drug Screen, Qualitative ([facility] only)     Status: None   Collection Time: 02/16/16  8:23 PM  Result Value Ref Range   Tricyclic, Ur Screen NONE DETECTED NONE DETECTED   Amphetamines, Ur Screen NONE DETECTED NONE DETECTED   MDMA (Ecstasy)Ur Screen NONE DETECTED NONE DETECTED   Cocaine Metabolite,Ur [place] NONE DETECTED NONE DETECTED   Opiate, Ur Screen NONE DETECTED NONE DETECTED   Phencyclidine (PCP) Ur S NONE DETECTED NONE DETECTED   Cannabinoid 50 Ng, Ur [place] NONE DETECTED NONE DETECTED   Barbiturates, Ur Screen NONE DETECTED NONE DETECTED   Benzodiazepine, Ur Scrn NONE DETECTED NONE DETECTED   Methadone Scn, Ur NONE DETECTED NONE DETECTED    Comment: (NOTE) 505  Tricyclics, urine               Cutoff 1000 ng/mL 200   Amphetamines, urine             Cutoff 1000 ng/mL 300  MDMA (Ecstasy), urine           Cutoff 500 ng/mL 400  Cocaine Metabolite, urine       Cutoff 300 ng/mL 500  Opiate, urine                   Cutoff 300 ng/mL 600  Phencyclidine (PCP), urine      Cutoff 25 ng/mL 700  Cannabinoid, urine              Cutoff 50 ng/mL 800  Barbiturates, urine             Cutoff 200 ng/mL 900  Benzodiazepine, urine           Cutoff 200 ng/mL 1000 Methadone, urine                Cutoff 300 ng/mL 1100 1200 The urine drug screen provides only a preliminary, unconfirmed 1300 analytical test result and should not be used for non-medical 1400 purposes. Clinical consideration and professional judgment should 1500 be applied to any positive drug screen result due to possible 1600 interfering substances. A more specific alternate chemical method 1700 must be used in order to obtain a confirmed analytical result.  1800 Gas chromato graphy / mass spectrometry (GC/MS) is the preferred 1900 confirmatory method.   Urinalysis complete, with microscopic ([facility] only)     Status: Abnormal   Collection Time: 02/16/16  8:23 PM  Result Value Ref Range   Color, Urine AMBER (A) YELLOW   APPearance CLOUDY (A) CLEAR   Glucose, UA 50 (A) NEGATIVE mg/dL   Bilirubin Urine NEGATIVE NEGATIVE   Ketones, ur 2+ (A) NEGATIVE mg/dL   Specific Gravity, Urine 1.023 1.005 - 1.030   Hgb urine dipstick 2+ (A) NEGATIVE  pH 5.0 5.0 - 8.0   Protein, ur >500 (A) NEGATIVE mg/dL   Nitrite NEGATIVE NEGATIVE   Leukocytes, UA NEGATIVE NEGATIVE   RBC / HPF 0-5 0 - 5 RBC/hpf   WBC, UA 6-30 0 - 5 WBC/hpf   Bacteria, UA RARE (A) NONE SEEN   Squamous Epithelial / LPF TOO NUMEROUS TO COUNT (A) NONE SEEN   Mucous PRESENT    Hyaline Casts, UA PRESENT   Blood gas, venous     Status: Abnormal   Collection Time: 02/16/16  9:52 PM  Result Value Ref Range   FIO2 0.21    pH, Ven 7.47 (H) 7.320 - 7.430   pCO2, Ven 38 (L) 44.0 - 60.0 mmHg   pO2, Ven 40.0  31.0 - 45.0 mmHg   Bicarbonate 27.7 21.0 - 28.0 mEq/L   Acid-Base Excess 3.9 (H) 0.0 - 3.0 mmol/L   O2 Saturation 78.9 %   Patient temperature 37.0    Collection site VENOUS    Sample type VENOUS   Ethanol     Status: None   Collection Time: 02/17/16  4:01 PM  Result Value Ref Range   Alcohol, Ethyl (B) <5 <5 mg/dL    Comment:        LOWEST DETECTABLE LIMIT FOR SERUM ALCOHOL IS 5 mg/dL FOR MEDICAL PURPOSES ONLY     Blood Alcohol level:  Lab Results  Component Value Date   ETH <5 02/17/2016   ETH 336* 10/93/2355    Metabolic Disorder Labs:  Lab Results  Component Value Date   HGBA1C 5.4 11/21/2015   MPG 108 11/21/2015   No results found for: PROLACTIN No results found for: CHOL, TRIG, HDL, CHOLHDL, VLDL, LDLCALC  Current Medications: Current Facility-Administered Medications  Medication Dose Route Frequency Provider Last Rate Last Dose  . acetaminophen (TYLENOL) tablet 650 mg  650 mg Oral Q6H PRN Laverle Hobby, PA-C      . alum & mag hydroxide-simeth (MAALOX/MYLANTA) 200-200-20 MG/5ML suspension 30 mL  30 mL Oral Q4H PRN Laverle Hobby, PA-C      . FLUoxetine (PROZAC) capsule 20 mg  20 mg Oral Daily Laverle Hobby, PA-C   20 mg at 02/18/16 0809  . hydrOXYzine (ATARAX/VISTARIL) tablet 25 mg  25 mg Oral Q6H PRN Laverle Hobby, PA-C      . loperamide (IMODIUM) capsule 2-4 mg  2-4 mg Oral PRN Laverle Hobby, PA-C      . LORazepam (ATIVAN) tablet 1 mg  1 mg Oral Q6H PRN Laverle Hobby, PA-C      . LORazepam (ATIVAN) tablet 1 mg  1 mg Oral QID Laverle Hobby, PA-C   1 mg at 02/18/16 0809   Followed by  . [START ON 02/19/2016] LORazepam (ATIVAN) tablet 1 mg  1 mg Oral TID Laverle Hobby, PA-C       Followed by  . [START ON 02/20/2016] LORazepam (ATIVAN) tablet 1 mg  1 mg Oral BID Laverle Hobby, PA-C       Followed by  . [START ON 02/21/2016] LORazepam (ATIVAN) tablet 1 mg  1 mg Oral Daily Spencer E Simon, PA-C      . magnesium hydroxide (MILK OF MAGNESIA) suspension  30 mL  30 mL Oral Daily PRN Laverle Hobby, PA-C      . multivitamin with minerals tablet 1 tablet  1 tablet Oral Daily Laverle Hobby, PA-C   1 tablet at 02/18/16 7322  . ondansetron (ZOFRAN-ODT) disintegrating tablet 4  mg  4 mg Oral Q6H PRN Laverle Hobby, PA-C      . risperiDONE (RISPERDAL) tablet 0.5 mg  0.5 mg Oral BID Laverle Hobby, PA-C   0.5 mg at 02/18/16 0809  . thiamine (B-1) injection 100 mg  100 mg Intramuscular Once Laverle Hobby, PA-C   100 mg at 02/17/16 2150  . thiamine (VITAMIN B-1) tablet 100 mg  100 mg Oral Daily Laverle Hobby, PA-C   100 mg at 02/18/16 0809  . traZODone (DESYREL) tablet 50 mg  50 mg Oral QHS,MR X 1 Laverle Hobby, PA-C   50 mg at 02/17/16 2150   PTA Medications: Prescriptions prior to admission  Medication Sig Dispense Refill Last Dose  . amoxicillin (AMOXIL) 500 MG capsule Take 1 capsule (500 mg total) by mouth every 12 (twelve) hours. For infection (Patient not taking: Reported on 02/16/2016)   Completed Course at Unknown time  . FLUoxetine (PROZAC) 40 MG capsule Take 1 capsule (40 mg total) by mouth daily. For depression 30 capsule 0 unknown at unknown  . hydrOXYzine (ATARAX/VISTARIL) 25 MG tablet Take 1 tablet (25 mg total) by mouth every 6 (six) hours as needed for anxiety. 60 tablet 0 PRN at PRN  . mirtazapine (REMERON) 7.5 MG tablet Take 1 tablet (7.5 mg total) by mouth at bedtime. For insomnia 30 tablet 0 unknown at unknown  . nicotine (NICODERM CQ - DOSED IN MG/24 HOURS) 21 mg/24hr patch Place 1 patch (21 mg total) onto the skin daily. For smoking cessation 28 patch 0 unknown at unknown  . risperiDONE (RISPERDAL) 0.5 MG tablet Take 1 tablet (0.5 mg total) by mouth 2 (two) times daily. For mood control 60 tablet 0 unknown at unknown    Musculoskeletal: Strength & Muscle Tone: within normal limits Gait & Station: normal Patient leans: N/A  Psychiatric Specialty Exam: Physical Exam  Nursing note and vitals reviewed. Constitutional: She  is oriented to person, place, and time. She appears well-developed and well-nourished.  HENT:  Head: Normocephalic.  Neurological: She is alert and oriented to person, place, and time. No cranial nerve deficit.  Skin: Skin is warm and dry.    Review of Systems  Musculoskeletal: Positive for joint pain.  Psychiatric/Behavioral: Positive for substance abuse. The patient is nervous/anxious.   All other systems reviewed and are negative.   Blood pressure 108/64, pulse 109, temperature 98.4 F (36.9 C), temperature source Oral, resp. rate 16, height _0  (1.676 m), weight 55.339 kg (122 lb), SpO2 97 %.Body mass index is 19.7 kg/(m^2).  General Appearance: Casual and Fairly Groomed  Engineer, water::  Fair  Speech:  Clear and Coherent and Normal Rate  Volume:  Normal  Mood:  Euthymic  Affect:  Appropriate and Congruent  Thought Process:  Coherent, Goal Directed and Linear  Orientation:  Full (Time, Place, and Person)  Thought Content:  Symptoms, worries, cocnerns  Suicidal Thoughts:  No  Homicidal Thoughts:  No  Memory:  Immediate;   Fair  Judgement:  Fair  Insight:  Fair  Psychomotor Activity:  Normal  Concentration:  Fair  Recall:  [employer] of Knowledge:Fair  Language: Good  Akathisia:  Negative  Handed:  Right  AIMS (if indicated):     Assets:  Desire for Improvement  ADL's:  Intact  Cognition: WNL  Sleep:      Treatment Plan Summary: MDD (major depressive disorder), recurrent severe, without psychosis ([place]), improving, stable for outpatient treatment.   Disposition: -Discharge home -Rx for  Prozac, Risperidone, and Trazodone for 14 days  Benjamine Mola, [place] 3/22/201710:56 AM

## 2016-03-17 ENCOUNTER — Encounter: Payer: Self-pay | Admitting: Emergency Medicine

## 2016-03-17 ENCOUNTER — Emergency Department
Admission: EM | Admit: 2016-03-17 | Discharge: 2016-03-18 | Disposition: A | Discharge status: Home / Self Care | Payer: MEDICAID | Attending: Emergency Medicine | Admitting: Emergency Medicine

## 2016-03-17 DIAGNOSIS — F10229 Alcohol dependence with intoxication, unspecified: Secondary | ICD-10-CM | POA: Insufficient documentation

## 2016-03-17 DIAGNOSIS — F10220 Alcohol dependence with intoxication, uncomplicated: Secondary | ICD-10-CM

## 2016-03-17 DIAGNOSIS — F17210 Nicotine dependence, cigarettes, uncomplicated: Secondary | ICD-10-CM | POA: Insufficient documentation

## 2016-03-17 DIAGNOSIS — Z79899 Other long term (current) drug therapy: Secondary | ICD-10-CM | POA: Insufficient documentation

## 2016-03-17 LAB — ETHANOL: ALCOHOL ETHYL (B): 441 mg/dL — AB (ref <5)

## 2016-03-17 LAB — CBC WITH DIFFERENTIAL/PLATELET
BASOS ABS: 0.1 10*3/uL (ref 0–0.1)
BASOS PCT: 1 %
EOS ABS: 0.1 10*3/uL (ref 0–0.7)
Eosinophils Relative: 2 %
HCT: 43 % (ref 35.0–47.0)
HEMOGLOBIN: 14.4 g/dL (ref 12.0–16.0)
Lymphocytes Relative: 44 %
Lymphs Abs: 3.2 10*3/uL (ref 1.0–3.6)
MCH: 31.8 pg (ref 26.0–34.0)
MCHC: 33.4 g/dL (ref 32.0–36.0)
MCV: 95.2 fL (ref 80.0–100.0)
Monocytes Absolute: 0.4 10*3/uL (ref 0.2–0.9)
Monocytes Relative: 5 %
NEUTROS PCT: 48 %
Neutro Abs: 3.5 10*3/uL (ref 1.4–6.5)
Platelets: 201 10*3/uL (ref 150–440)
RBC: 4.52 MIL/uL (ref 3.80–5.20)
RDW: 17.2 % — ABNORMAL HIGH (ref 11.5–14.5)
WBC: 7.2 10*3/uL (ref 3.6–11.0)

## 2016-03-17 LAB — URINE DRUG SCREEN, QUALITATIVE (ARMC ONLY)
Amphetamines, Ur Screen: NOT DETECTED
BARBITURATES, UR SCREEN: NOT DETECTED
Benzodiazepine, Ur Scrn: NOT DETECTED
COCAINE METABOLITE, UR ~~LOC~~: NOT DETECTED
Cannabinoid 50 Ng, Ur ~~LOC~~: NOT DETECTED
MDMA (ECSTASY) UR SCREEN: NOT DETECTED
METHADONE SCREEN, URINE: NOT DETECTED
Opiate, Ur Screen: NOT DETECTED
Phencyclidine (PCP) Ur S: NOT DETECTED
TRICYCLIC, UR SCREEN: NOT DETECTED

## 2016-03-17 LAB — COMPREHENSIVE METABOLIC PANEL
ALK PHOS: 97 U/L (ref 38–126)
ALT: 31 U/L (ref 14–54)
ANION GAP: 17 — AB (ref 5–15)
AST: 40 U/L (ref 15–41)
Albumin: 4.3 g/dL (ref 3.5–5.0)
BILIRUBIN TOTAL: 0.2 mg/dL — AB (ref 0.3–1.2)
BUN: 10 mg/dL (ref 6–20)
CALCIUM: 8.5 mg/dL — AB (ref 8.9–10.3)
CO2: 22 mmol/L (ref 22–32)
Chloride: 104 mmol/L (ref 101–111)
Creatinine, Ser: 0.56 mg/dL (ref 0.44–1.00)
GFR calc non Af Amer: >60 mL/min (ref >60)
Glucose, Bld: 82 mg/dL (ref 65–99)
Potassium: 3.5 mmol/L (ref 3.5–5.1)
Sodium: 143 mmol/L (ref 135–145)
TOTAL PROTEIN: 7.3 g/dL (ref 6.5–8.1)

## 2016-03-17 LAB — MAGNESIUM: Magnesium: 1.5 mg/dL — ABNORMAL LOW (ref 1.7–2.4)

## 2016-03-17 MED ORDER — VITAMIN B-1 100 MG PO TABS: 100.0000 mg | ORAL_TABLET | Freq: Every day | ORAL | Status: DC

## 2016-03-17 MED ORDER — LORAZEPAM 2 MG PO TABS: 0.0000 mg | ORAL_TABLET | Freq: Two times a day (BID) | ORAL | Status: DC

## 2016-03-17 MED ORDER — LORAZEPAM 2 MG PO TABS
0.0000 mg | ORAL_TABLET | Freq: Four times a day (QID) | ORAL | Status: DC
  Administered 2016-03-18: 2 mg via ORAL
  Filled 2016-03-17: qty 1

## 2016-03-17 MED ORDER — THIAMINE HCL 100 MG/ML IJ SOLN: 100.0000 mg | Freq: Every day | INTRAMUSCULAR | Status: DC

## 2016-03-17 NOTE — ED Provider Notes (Signed)
[facility] Emergency Department Provider Note ____________________________________________  Time seen: Approximately 4:35PM  I have reviewed the triage vital signs and the nursing notes.   HISTORY  Chief Complaint Alcohol Intoxication    HPI Amanda SimsLisa Huff is a 55 y.o. female who presents via EMS intoxicated. Patient states that she wants help to stop drinking. She states she drinks "too much" vodka a day. She denies suicidal thoughts.  She denies any recent acute illness. She states that she has chronic left hip pain from arthritis but denies any new or worsening symptoms.  Per EMS her vital signs were within normal limits, blood sugar was 88.  Per review of records patient has a history of complicated withdrawal including seizures.  Past Medical History  Diagnosis Date  . ETOH abuse   . Arthritis     Patient Active Problem List   Diagnosis Date Noted  . Seizure due to alcohol withdrawal (HCC) 02/24/2016  . MDD (major depressive disorder), recurrent severe, without psychosis (HCC) 02/18/2016  . Substance induced mood disorder (HCC) 02/17/2016  . Alcohol withdrawal (HCC) 02/17/2016  . Arthritis 02/17/2016  . 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 Alcohol use disorder, severe, dependence (HCC) 11/11/2015  . Alcohol abuse with alcohol-induced mood disorder (HCC) 10/29/2015  . Depression, major, recurrent, moderate (HCC) 10/29/2015    Past Surgical History  Procedure Laterality Date  . Cesarean section      Current Outpatient Rx  Name  Route  Sig  Dispense  Refill  . hydrOXYzine (ATARAX/VISTARIL) 25 MG tablet   Oral   Take 1 tablet (25 mg total) by mouth every 6 (six) hours as needed. Patient taking differently: Take 25 mg by mouth every 6 (six) hours as needed for anxiety.    21 tablet   0   . mirtazapine (REMERON) 7.5 MG tablet   Oral   Take 1 tablet (7.5 mg total) by mouth at bedtime. For insomnia   14  tablet   0   . risperiDONE (RISPERDAL) 0.5 MG tablet   Oral   Take 1 tablet (0.5 mg total) by mouth 2 (two) times daily. For mood control   28 tablet   0   . traZODone (DESYREL) 50 MG tablet   Oral   Take 1 tablet (50 mg total) by mouth at bedtime as needed for sleep.   14 tablet   0     Allergies Review of patient's allergies indicates no known allergies.  History reviewed. No pertinent family history.  Social History Social History  Substance Use Topics  . Smoking status: Current Every Day Smoker -- 0.50 packs/day    Types: Cigarettes  . Smokeless tobacco: None  . Alcohol Use: Yes     Comment: 1 bottle of vodka every 3 days    Review of Systems Constitutional: No fever/chills Cardiovascular: Denies chest pain. Respiratory: Denies shortness of breath. Gastrointestinal: No abdominal pain.   Musculoskeletal: Negative for back pain. Neurological: Negative for headaches  10-point ROS otherwise negative.  ____________________________________________   PHYSICAL EXAM:  VITAL SIGNS: ED Triage Vitals  Enc Vitals Group     BP 03/17/16 1638 110/73 mmHg     Pulse Rate 03/17/16 1638 102     Resp 03/17/16 1638 18     Temp 03/17/16 1638 97.7 F (36.5 C)     Temp Source 03/17/16 1638 Axillary     SpO2 03/17/16 1638 94 %     Weight 03/17/16 1638 140 lb (63.504 kg)  Height 03/17/16 1638  (1.651 m)     Head Cir --      Peak Flow --      Pain Score 03/17/16 1639 5     Pain Loc --      Pain Edu? --      Excl. in GC? --    Constitutional: Alert and oriented; in no acute distress; intoxicated Eyes: Conjunctivae are normal. PERRL. EOMI. Head: Atraumatic. Nose: No congestion/rhinnorhea. Mouth/Throat: Mucous membranes are moist.  Oropharynx non-erythematous. Neck: No stridor.   Cardiovascular: Normal rate, regular rhythm. Grossly normal heart sounds.  Good peripheral circulation. Respiratory: Normal respiratory effort.  No retractions. Lungs  CTAB. Gastrointestinal: Soft and nontender. No distention. No abdominal bruits. No CVA tenderness. Musculoskeletal: No lower extremity tenderness nor edema.   Neurologic:  Normal speech and language. No gross focal neurologic deficits are appreciated.  Skin:  Skin is warm, dry and intact. No rash noted. Psychiatric: intoxicated  ____________________________________________   LABS (all labs ordered are listed, but only abnormal results are displayed)  Labs Reviewed  COMPREHENSIVE METABOLIC PANEL - Abnormal; Notable for the following:    Calcium 8.5 (*)    Total Bilirubin 0.2 (*)    Anion gap 17 (*)    All other components within normal limits  ETHANOL - Abnormal; Notable for the following:    Alcohol, Ethyl (B) 441 (*)    All other components within normal limits  CBC WITH DIFFERENTIAL/PLATELET - Abnormal; Notable for the following:    RDW 17.2 (*)    All other components within normal limits  MAGNESIUM - Abnormal; Notable for the following:    Magnesium 1.5 (*)    All other components within normal limits  URINE DRUG SCREEN, QUALITATIVE ([facility] ONLY)   _ ____________________________________________   INITIAL IMPRESSION / ASSESSMENT AND PLAN / ED COURSE  Pertinent labs & imaging results that were available during my care of the patient were reviewed by me and considered in my medical decision making (see chart for details).  Pt is voluntary for ETOH detox. ____________________________________________   FINAL CLINICAL IMPRESSION(S) / ED DIAGNOSES intoxicated   New prescriptions started this visit New Prescriptions   No medications on file     Maurilio Lovely, MD 03/17/16 2256

## 2016-03-17 NOTE — ED Notes (Signed)
VOL/ETOH

## 2016-03-17 NOTE — ED Notes (Signed)
Pt.is resting comfortably.Will continue to do checks

## 2016-03-17 NOTE — ED Notes (Signed)
Pt bed linens changed

## 2016-03-17 NOTE — ED Notes (Signed)
Pt via ems from home; picked up for alcohol intoxication. Pt naked on arrival.

## 2016-03-18 NOTE — ED Notes (Signed)
BEHAVIORAL HEALTH ROUNDING Patient sleeping: No. Patient alert and oriented: yes Behavior appropriate: Yes.  ; If no, describe:  Nutrition and fluids offered: yes Toileting and hygiene offered: Yes  Sitter present: q15 minute observations and security  monitoring Law enforcement present: Yes  ODS  

## 2016-03-18 NOTE — ED Provider Notes (Addendum)
The patient is awake and alert. She reports that she "drank too much last night". She denies wanting to harm herself or anyone else. She reports that she is an alcoholic. Offered her detox evaluation, she is not interested and does not wish for detox. She reports she is tried previously, but wishes to be discharged home.  She is awake alert and oriented in no distress. I do think she is at some risk for withdrawal, and discussed this with her and offered detox but she does not wish to be hospitalized. She is currently awake alert and in no distress. She is not driving home.  She is awake, ambulates with normal and stable gait. Currently no tremors, no elevated mood. She appears well, no evidence of clinical intoxication at this time.  Discussed signs and symptoms of withdrawal, for which she reports she is quite familiar, and if she is to have any withdrawal symptoms or develop any severe shaking, tremulousness, confusion, or other concerns she'll call 911 or return to the ER for reevaluation.  Sharyn CreamerMark Danial Hlavac, MD 03/18/16 1111  Sharyn CreamerMark Jahdiel Krol, MD 03/18/16 [phone]

## 2016-03-18 NOTE — ED Notes (Signed)
Pt awake and ambulating to the BR -  Reports anxiety and tremors to me

## 2016-03-18 NOTE — ED Notes (Signed)
ED BHU PLACEMENT JUSTIFICATION Is the patient under IVC or is there intent for IVC: no Is the patient medically cleared: Yes.   Is there vacancy in the ED BHU: Yes.   Is the population mix appropriate for patient: Yes.   Is the patient awaiting placement in inpatient or outpatient setting: no   Has the patient had a psychiatric consult:  No not ordered  MD allowing her to sober up - pt declines detox today  Survey of unit performed for contraband, proper placement and condition of furniture, tampering with fixtures in bathroom, shower, and each patient room: Yes.  ; Findings:  APPEARANCE/BEHAVIOR Calm and cooperative NEURO ASSESSMENT Orientation: oriented x3  Denies pain Hallucinations: No.None noted (Hallucinations) Speech: Normal Gait: normal RESPIRATORY ASSESSMENT Even  Unlabored respirations  CARDIOVASCULAR ASSESSMENT Pulses equal   regular rate  Skin warm and dry   GASTROINTESTINAL ASSESSMENT no GI complaint EXTREMITIES Full ROM  PLAN OF CARE Provide calm/safe environment. Vital signs assessed twice daily. ED BHU Assessment once each 12-hour shift. Collaborate with intake RN daily or as condition indicates. Assure the ED provider has rounded once each shift. Provide and encourage hygiene. Provide redirection as needed. Assess for escalating behavior; address immediately and inform ED provider.  Assess family dynamic and appropriateness for visitation as needed: Yes.  ; If necessary, describe findings:  Educate the patient/family about BHU procedures/visitation: Yes.  ; If necessary, describe findings:

## 2016-03-18 NOTE — ED Notes (Signed)
Discharge instructions reviewed with her and she verbalized agreement and understanding  She continues to decline detox and detox information - she called for a cab to take her home

## 2016-03-18 NOTE — Discharge Instructions (Signed)
Alcohol Intoxication  Alcohol intoxication occurs when the amount of alcohol that a person has consumed impairs his or her ability to mentally and physically function. Alcohol directly impairs the normal chemical activity of the brain. Drinking large amounts of alcohol can lead to changes in mental function and behavior, and it can cause many physical effects that can be harmful.   Alcohol intoxication can range in severity from mild to very severe. Various factors can affect the level of intoxication that occurs, such as the person's age, gender, weight, frequency of alcohol consumption, and the presence of other medical conditions (such as diabetes, seizures, or heart conditions). Dangerous levels of alcohol intoxication may occur when people drink large amounts of alcohol in a short period (binge drinking). Alcohol can also be especially dangerous when combined with certain prescription medicines or "recreational" drugs.  SIGNS AND SYMPTOMS  Some common signs and symptoms of mild alcohol intoxication include:  · Loss of coordination.  · Changes in mood and behavior.  · Impaired judgment.  · Slurred speech.  As alcohol intoxication progresses to more severe levels, other signs and symptoms will appear. These may include:  · Vomiting.  · Confusion and impaired memory.  · Slowed breathing.  · Seizures.  · Loss of consciousness.  DIAGNOSIS   Your health care provider will take a medical history and perform a physical exam. You will be asked about the amount and type of alcohol you have consumed. Blood tests will be done to measure the concentration of alcohol in your blood. In many places, your blood alcohol level must be lower than 80 mg/dL (0.08%) to legally drive. However, many dangerous effects of alcohol can occur at much lower levels.   TREATMENT   People with alcohol intoxication often do not require treatment. Most of the effects of alcohol intoxication are temporary, and they go away as the alcohol naturally  leaves the body. Your health care provider will monitor your condition until you are stable enough to go home. Fluids are sometimes given through an IV access tube to help prevent dehydration.   HOME CARE INSTRUCTIONS  · Do not drive after drinking alcohol.  · Stay hydrated. Drink enough water and fluids to keep your urine clear or pale yellow. Avoid caffeine.    · Only take over-the-counter or prescription medicines as directed by your health care provider.    SEEK MEDICAL CARE IF:   · You have persistent vomiting.    · You do not feel better after a few days.  · You have frequent alcohol intoxication. Your health care provider can help determine if you should see a substance use treatment counselor.  SEEK IMMEDIATE MEDICAL CARE IF:   · You become shaky or tremble when you try to stop drinking.    · You shake uncontrollably (seizure).    · You throw up (vomit) blood. This may be bright red or may look like black coffee grounds.    · You have blood in your stool. This may be bright red or may appear as a black, tarry, bad smelling stool.    · You become lightheaded or faint.    MAKE SURE YOU:   · Understand these instructions.  · Will watch your condition.  · Will get help right away if you are not doing well or get worse.     This information is not intended to replace advice given to you by your health care provider. Make sure you discuss any questions you have with your health care provider.       Document Released: 08/25/2005 Document Revised: 07/18/2013 Document Reviewed: 04/20/2013  [employer] Interactive Patient Education ©2016 [employer]

## 2016-03-18 NOTE — ED Notes (Signed)

## 2016-03-18 NOTE — ED Notes (Signed)
Breakfast provided.

## 2016-12-19 IMAGING — CT CT HEAD W/O CM
2 of 3 series · 17 of 30 positions shown, 20 images · non-contrast
Comparison: None.

CLINICAL DATA: Headache.  Ethanol intoxication.

EXAM:
CT HEAD WITHOUT CONTRAST
TECHNIQUE: Contiguous axial images were obtained from the base of the skull
through the vertex without intravenous contrast.

[Series 2: head wo · axial · 0.42mm/px · z∈[-160,-34]mm · 9 of 33 slices shown, 12 images]
[im 4/33  brain]
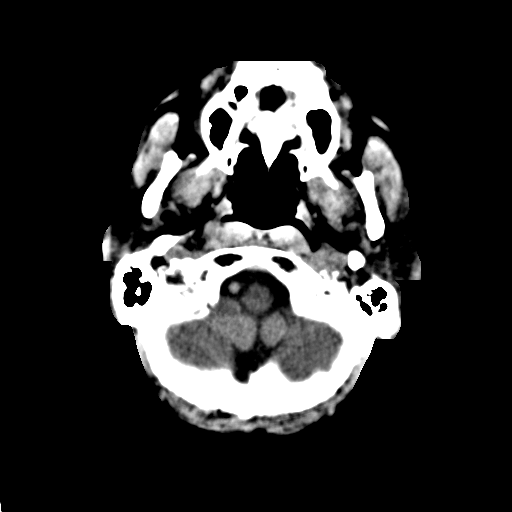
[im 4/33  bone]
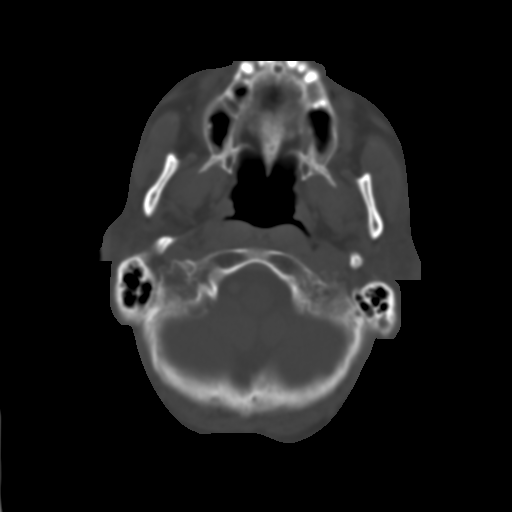
[im 7/33  brain]
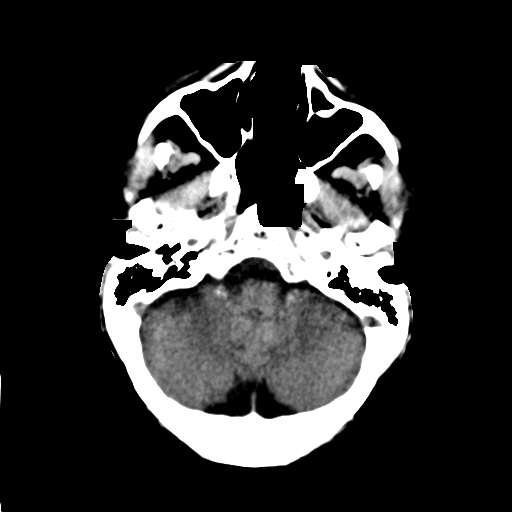
[im 10/33  brain]
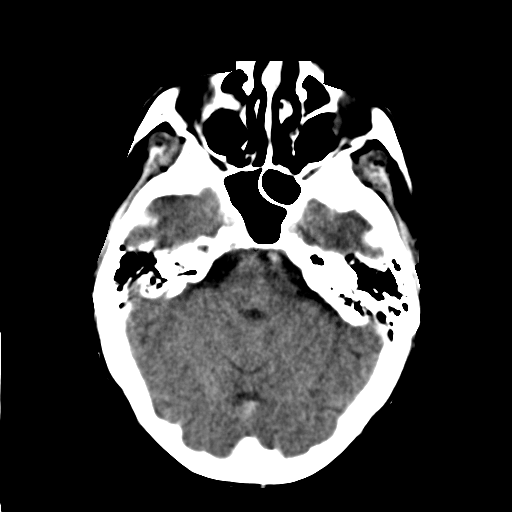
[im 13/33  brain]
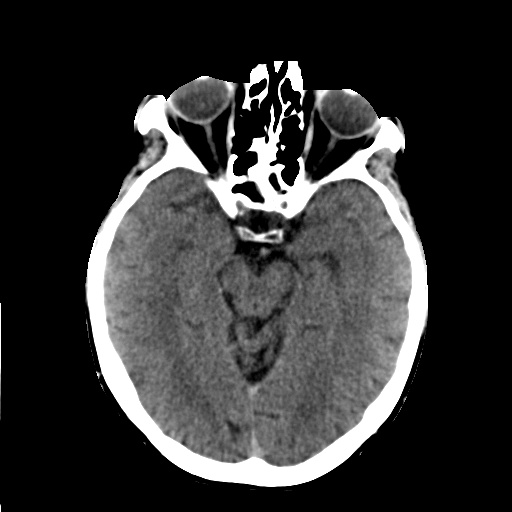
[im 17/33  brain]
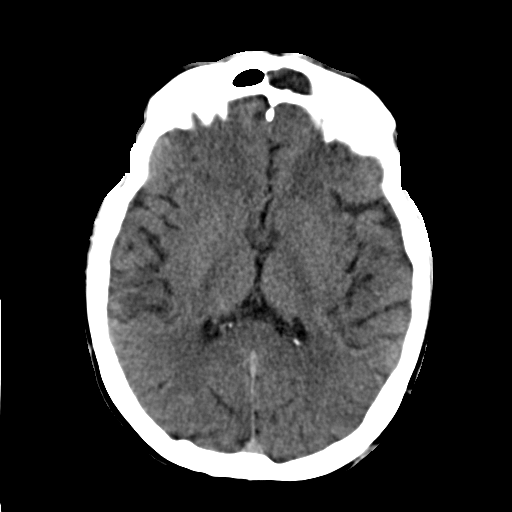
[im 17/33  bone]
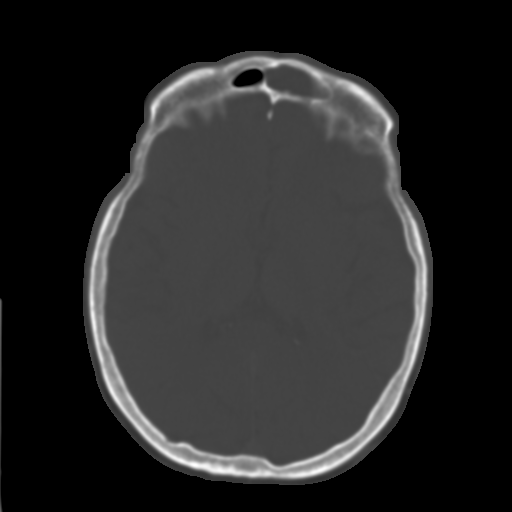
[im 20/33  brain]
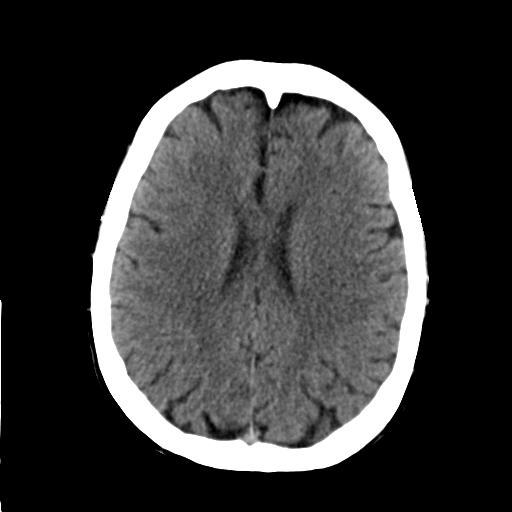
[im 23/33  brain]
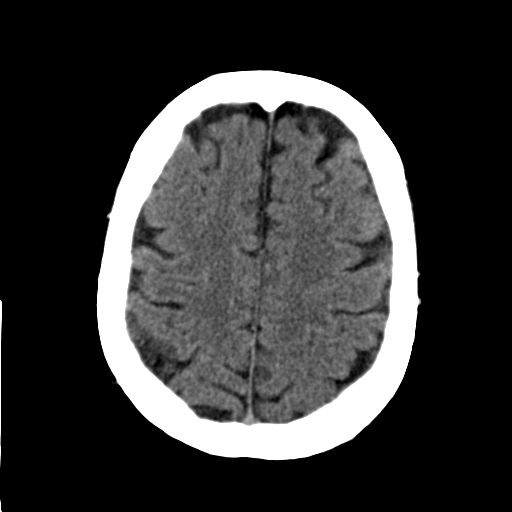
[im 26/33  brain]
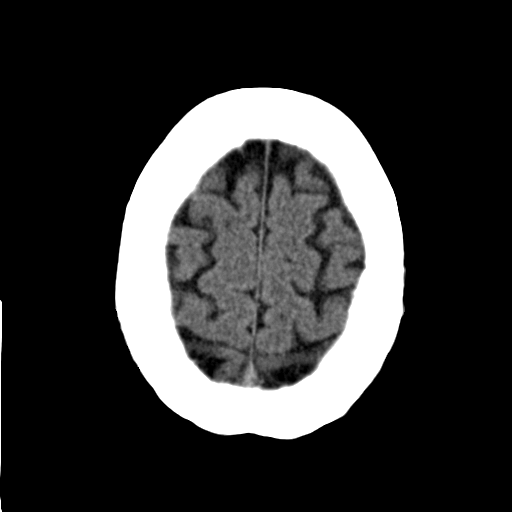
[im 29/33  brain]
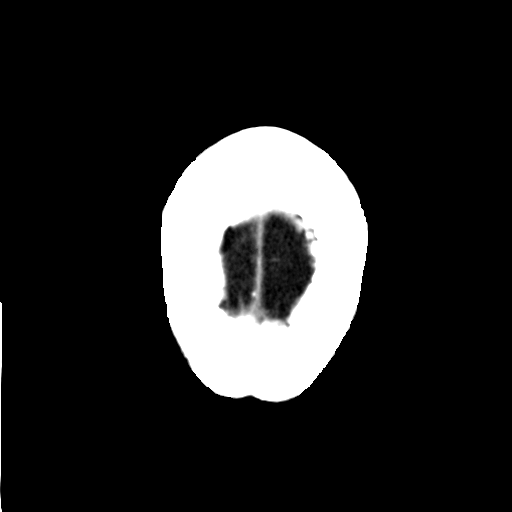
[im 29/33  bone]
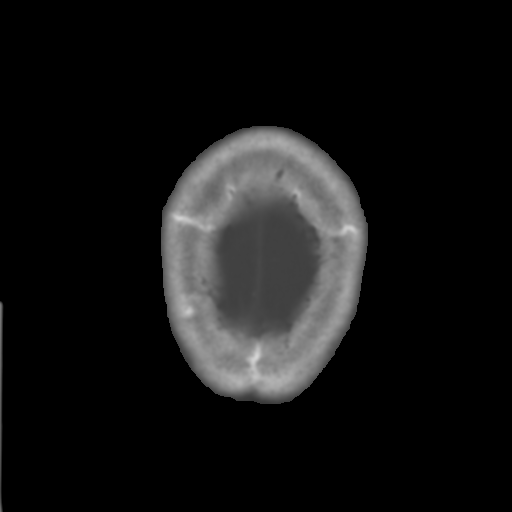

[Series 4: head wo recons · axial · 0.42mm/px · z∈[-68,+17]mm · 8 of 29 slices shown]
[im 4/29  brain]
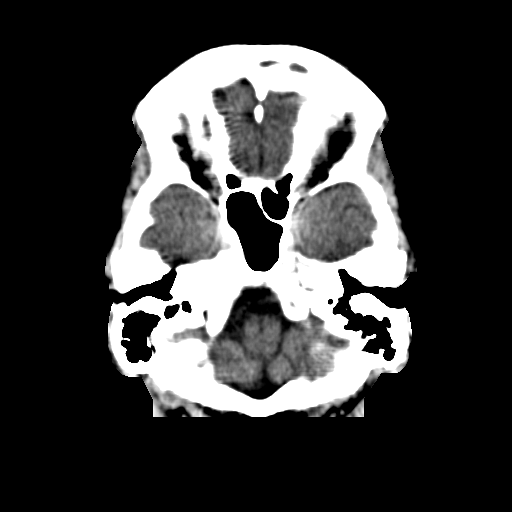
[im 7/29  brain]
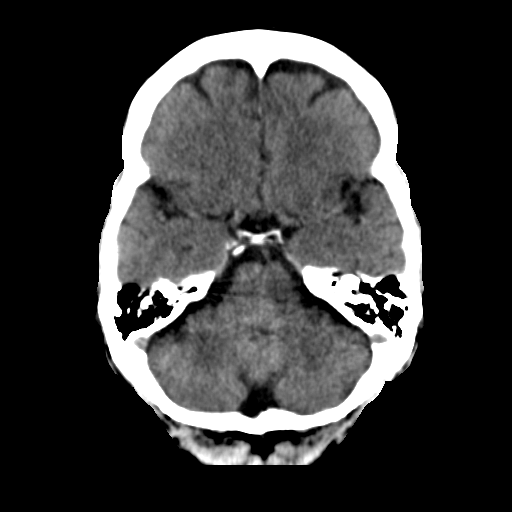
[im 10/29  brain]
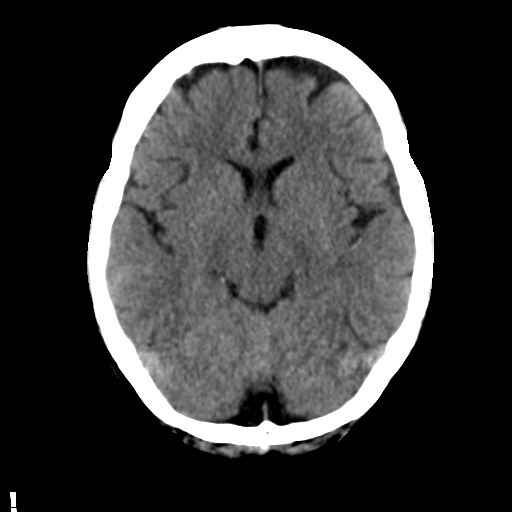
[im 13/29  brain]
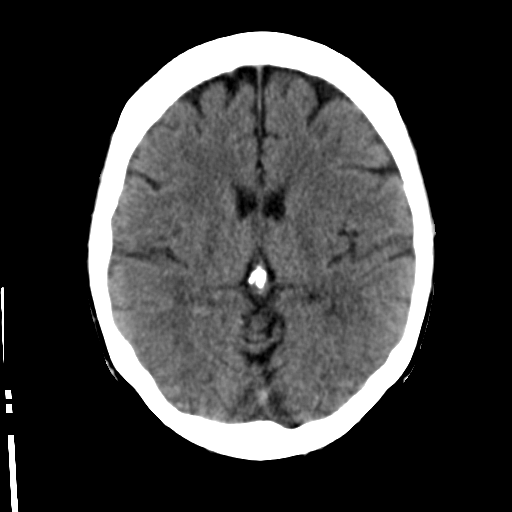
[im 16/29  brain]
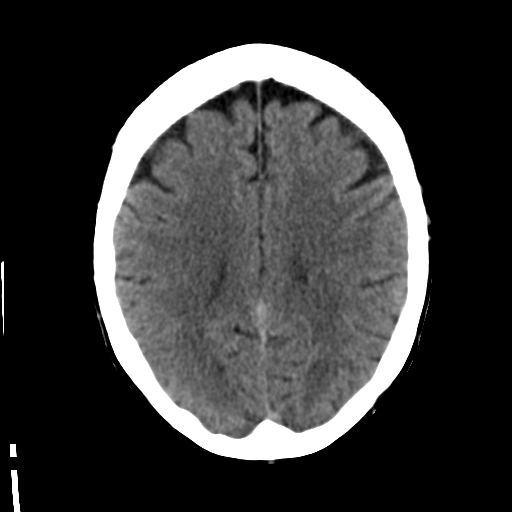
[im 19/29  brain]
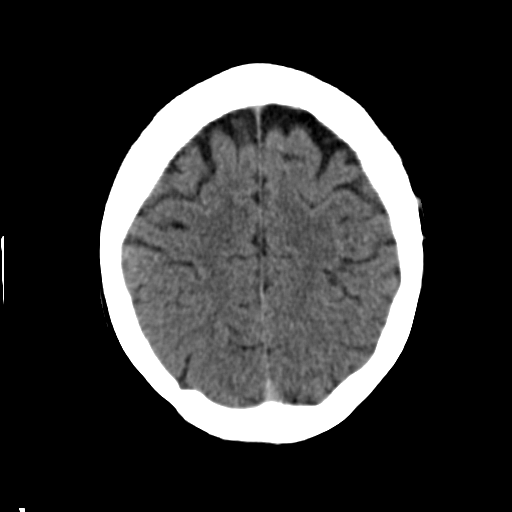
[im 22/29  brain]
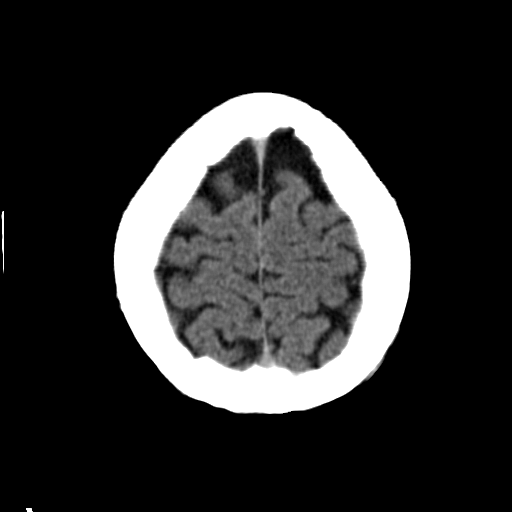
[im 25/29  brain]
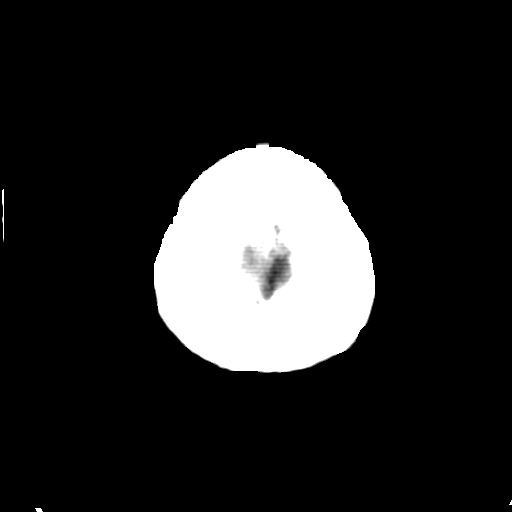

[17 of 30 positions shown; findings below may reference images not displayed]

FINDINGS: The brainstem, cerebellum, cerebral peduncles, thalami, basal
ganglia, basilar cisterns, and ventricular system appear within
normal limits. No intracranial hemorrhage, mass lesion, or acute
CVA. Chronic bilateral maxillary, ethmoid, and sphenoid sinusitis.
The left frontal sinus is essentially completely opacified,
potentially from acute or chronic sinusitis.
IMPRESSION: 1. No acute intracranial findings are identified.
2. Chronic bilateral maxillary, ethmoid, and sphenoid sinusitis with
complete opacification of the left frontal sinus.
